# Patient Record
Sex: Female | Born: 1975 | Race: White | Hispanic: Yes | Marital: Married | Smoking: Never smoker
Health system: Southern US, Community
[De-identification: ages and names within clinical notes are randomized; demographics above are authoritative.]

## PROBLEM LIST (undated history)

## (undated) DIAGNOSIS — M329 Systemic lupus erythematosus, unspecified: Secondary | ICD-10-CM

## (undated) DIAGNOSIS — E079 Disorder of thyroid, unspecified: Secondary | ICD-10-CM

## (undated) DIAGNOSIS — J45909 Unspecified asthma, uncomplicated: Secondary | ICD-10-CM

## (undated) DIAGNOSIS — IMO0002 Reserved for concepts with insufficient information to code with codable children: Secondary | ICD-10-CM

## (undated) HISTORY — DX: Unspecified asthma, uncomplicated: J45.909

## (undated) HISTORY — DX: Reserved for concepts with insufficient information to code with codable children: IMO0002

## (undated) HISTORY — DX: Systemic lupus erythematosus, unspecified: M32.9

## (undated) HISTORY — DX: Disorder of thyroid, unspecified: E07.9

---

## 2016-04-22 ENCOUNTER — Encounter: Payer: Self-pay | Admitting: Gynecology

## 2016-04-22 ENCOUNTER — Ambulatory Visit (INDEPENDENT_AMBULATORY_CARE_PROVIDER_SITE_OTHER): Payer: BLUE CROSS/BLUE SHIELD | Admitting: Gynecology

## 2016-04-22 VITALS — BP 130/80 | Ht 62.0 in | Wt 158.0 lb

## 2016-04-22 DIAGNOSIS — N643 Galactorrhea not associated with childbirth: Secondary | ICD-10-CM | POA: Diagnosis not present

## 2016-04-22 DIAGNOSIS — N941 Unspecified dyspareunia: Secondary | ICD-10-CM

## 2016-04-22 DIAGNOSIS — Z01411 Encounter for gynecological examination (general) (routine) with abnormal findings: Secondary | ICD-10-CM

## 2016-04-22 DIAGNOSIS — L659 Nonscarring hair loss, unspecified: Secondary | ICD-10-CM | POA: Diagnosis not present

## 2016-04-22 DIAGNOSIS — N938 Other specified abnormal uterine and vaginal bleeding: Secondary | ICD-10-CM | POA: Diagnosis not present

## 2016-04-22 DIAGNOSIS — R102 Pelvic and perineal pain: Secondary | ICD-10-CM | POA: Diagnosis not present

## 2016-04-22 DIAGNOSIS — Z87410 Personal history of cervical dysplasia: Secondary | ICD-10-CM

## 2016-04-22 NOTE — Progress Notes (Signed)
Tara Caldwell 24-May-1975 [id number]   History:    41 y.o.  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 #5 a few weeks ago she has some slight discharge after intercourse with her partner but none today. #6 patient's been complaining of left lower quadrant discomfort on and off for the past few weeks #7 patient had noticed bilateral nipple discharge on and off for the past several months  Patient stated several years ago she was tested had a positive RPR but on repeat was negative she does have history of lupus. She stated she had a normal mammogram 6 months ago.  Past medical history,surgical history, family history and social history were all reviewed and documented in the EPIC chart.  Gynecologic History Patient's last menstrual period was 03/28/2016 (approximate). Contraception: Nexplanon Last Pap: See above. Results were: See above Last mammogram: 6 months ago. Results were: normal  Obstetric History OB History  Gravida Para Term Preterm AB Living  4 2     2 2   SAB TAB Ectopic Multiple Live Births  2            # Outcome Date GA Lbr Len/2nd Weight Sex Delivery Anes PTL Lv  4 SAB           3 SAB           2 Para           1 Para                ROS: A ROS was performed and pertinent positives and negatives are included in the history.  GENERAL: No fevers or chills. HEENT: No change in vision, no earache, sore throat or sinus congestion. NECK: No pain or stiffness. CARDIOVASCULAR: No chest pain or pressure. No  palpitations. PULMONARY: No shortness of breath, cough or wheeze. GASTROINTESTINAL: No abdominal pain, nausea, vomiting or diarrhea, melena or bright red blood per rectum. GENITOURINARY: No urinary frequency, urgency, hesitancy or dysuria. MUSCULOSKELETAL: No joint or muscle pain, no back pain, no recent trauma. DERMATOLOGIC: No rash, no itching, no lesions. ENDOCRINE: No polyuria, polydipsia, no heat or cold intolerance. No recent change in weight. HEMATOLOGICAL: No anemia or easy bruising or bleeding. NEUROLOGIC: No headache, seizures, numbness, tingling or weakness. PSYCHIATRIC: No depression, no loss of interest in normal activity or change in sleep pattern.     Exam: chaperone present  BP 130/80   Ht 5\' 2"  (1.575 m)   Wt 158 lb (71.7 kg)   LMP 03/28/2016 (Approximate)   BMI 28.90 kg/m   Body mass index is 28.9 kg/m.  General appearance : Well developed well nourished female. No acute distress HEENT: Eyes: no retinal hemorrhage or exudates,  Neck supple, trachea midline, no carotid bruits, no thyroidmegaly Lungs: Clear to auscultation, no rhonchi or wheezes, or rib retractions  Heart: Regular rate and rhythm, no murmurs or gallops Breast:Examined in sitting and supine position were symmetrical in appearance, no palpable masses or tenderness,  no  skin retraction, no nipple inversion, no nipple discharge, no skin discoloration, no axillary or supraclavicular lymphadenopathy Abdomen: no palpable masses or tenderness, no rebound or guarding Extremities: no edema or skin discoloration or tenderness  Pelvic:  Bartholin, Urethra, Skene Glands: Within normal limits             Vagina: No gross lesions or discharge  Cervix: No gross lesions or discharge  Uterus  anteverted, normal size, shape and consistency, non-tender and mobile  Adnexa  Without masses or tenderness  Anus and perineum  normal   Rectovaginal  normal sphincter tone without palpated masses or tenderness              Hemoccult not indicated     Assessment/Plan:  41 y.o. female for annual exam with history of bilateral galactorrhea we'll check her prolactin level today. Patient had complete blood work approximately 6 months ago whereby her hepatitis panel, HIV RPR hepatitis B and C thyroid function tests CBC lipid profile all were normal as was her TSH. Patient with past history of dysplasia Pap smear with Iris HPV done today. Because of her intermenstrual bleeding patient will return back to the office next week for sonohysterogram and endometrial biopsy. She will be given the name of the dermatologist for her to schedule an appointment for her alopecia workup since her recent TSH was normal.   Terrance Mass MD, 3:17 PM 04/22/2016

## 2016-04-22 NOTE — Addendum Note (Signed)
Addended by: Burnett Kanaris on: 04/22/2016 03:28 PM   Modules accepted: Orders

## 2016-04-23 LAB — PROLACTIN: Prolactin: 10.9 ng/mL

## 2016-04-24 LAB — PAP, TP IMAGING W/ HPV RNA, RFLX HPV TYPE 16,18/45: HPV mRNA, High Risk: NOT DETECTED

## 2016-04-28 ENCOUNTER — Telehealth: Payer: Self-pay | Admitting: *Deleted

## 2016-04-28 ENCOUNTER — Other Ambulatory Visit: Payer: Self-pay | Admitting: Gynecology

## 2016-04-28 DIAGNOSIS — R102 Pelvic and perineal pain: Secondary | ICD-10-CM

## 2016-04-28 DIAGNOSIS — N939 Abnormal uterine and vaginal bleeding, unspecified: Secondary | ICD-10-CM

## 2016-04-28 NOTE — Telephone Encounter (Signed)
Per Candis Musa at [facility] benefits verified not by code by Office Surgery & [place] in office setting. Covered at $40 copay KW CMA  Ref [id number]

## 2016-05-01 ENCOUNTER — Ambulatory Visit (INDEPENDENT_AMBULATORY_CARE_PROVIDER_SITE_OTHER): Payer: BLUE CROSS/BLUE SHIELD | Admitting: Gynecology

## 2016-05-01 ENCOUNTER — Ambulatory Visit (INDEPENDENT_AMBULATORY_CARE_PROVIDER_SITE_OTHER): Payer: BLUE CROSS/BLUE SHIELD

## 2016-05-01 ENCOUNTER — Encounter: Payer: Self-pay | Admitting: Gynecology

## 2016-05-01 DIAGNOSIS — R102 Pelvic and perineal pain: Secondary | ICD-10-CM

## 2016-05-01 DIAGNOSIS — Z975 Presence of (intrauterine) contraceptive device: Principal | ICD-10-CM

## 2016-05-01 DIAGNOSIS — Z978 Presence of other specified devices: Secondary | ICD-10-CM

## 2016-05-01 DIAGNOSIS — N941 Unspecified dyspareunia: Secondary | ICD-10-CM

## 2016-05-01 DIAGNOSIS — N938 Other specified abnormal uterine and vaginal bleeding: Secondary | ICD-10-CM

## 2016-05-01 DIAGNOSIS — N939 Abnormal uterine and vaginal bleeding, unspecified: Secondary | ICD-10-CM | POA: Diagnosis not present

## 2016-05-01 DIAGNOSIS — N921 Excessive and frequent menstruation with irregular cycle: Secondary | ICD-10-CM

## 2016-05-01 NOTE — Progress Notes (Signed)
   Patient's a 41 year old that was seen the office for the first time as a new patient early this month see previous note for details. Patient in [place] placed in [place] which is due to be removed this February and a complaint of some irregular vaginal bleeding and is here for sonohysterogram. Her recent Pap smear was normal as well. Patient has 2 children from a previous marriage and is contemplating on getting pregnant after the Nexplanon was removed.  Ultrasound/sonohysterogram: Uterus measured 8.7 x 5.6 x 4.4 cm within a major stripe at 2.5 mm. Right and left ovary were normal. No fluid in the cul-de-sac. No adnexal masses. A small nabothian cyst was noted near the cervix. The cervix was cleansed with Betadine solution a sterile catheter was introduced into the uterine cavity and after instilling normal saline and there was no intracavitary defect.  Assessment /plan 41 year old patient with breakthrough bleeding on Nexplanon soon to expired will return back next month to remove the Nexplanon. We had a discussion on the risk of advanced maternal age/pregnancy over the age of 2 in the future to include aneuploidy, hypertension, diabetes, and premature delivery. She states that her new spouse is 43 years of age. We discussed the female factor contributions to aneuploidy as well. She will think about it in the meantime she's going to start taking prenatal vitamins.

## 2016-05-16 ENCOUNTER — Ambulatory Visit (INDEPENDENT_AMBULATORY_CARE_PROVIDER_SITE_OTHER): Payer: BLUE CROSS/BLUE SHIELD | Admitting: Gynecology

## 2016-05-16 ENCOUNTER — Encounter: Payer: Self-pay | Admitting: Gynecology

## 2016-05-16 VITALS — BP 110/80 | Ht 62.0 in | Wt 158.0 lb

## 2016-05-16 DIAGNOSIS — Z3046 Encounter for surveillance of implantable subdermal contraceptive: Secondary | ICD-10-CM | POA: Diagnosis not present

## 2016-05-16 MED ORDER — DICLOXACILLIN SODIUM 250 MG PO CAPS: 250.0000 mg | ORAL_CAPSULE | Freq: Three times a day (TID) | ORAL | 0 refills | Status: DC

## 2016-05-16 NOTE — Progress Notes (Signed)
   Patient is a 41 year old presented to the office today requesting removal of 2 subdermal contraceptive implants similar to Nexplanon that was placed in [place] 3 years ago. She had been suffering from dysfunctional uterine bleeding and on the office visit on June 25 she had an ultrasound sonohysterogram which demonstrated the following:  Ultrasound/sonohysterogram: Uterus measured 8.7 x 5.6 x 4.4 cm within a major stripe at 2.5 mm. Right and left ovary were normal. No fluid in the cul-de-sac. No adnexal masses. A small nabothian cyst was noted near the cervix. The cervix was cleansed with Betadine solution a sterile catheter was introduced into the uterine cavity and after instilling normal saline and there was no intracavitary defect.  Procedure note to remove Nexplanon (2 capsules)                                                            Nexplanon procedure note (removal)  The patient presented to the office today requesting for removal of her Nexplanon that was placed in the year 2015 on her left arm.   On examination the nexplanon implant was palpated and the distal end  (end  closest to the elbow) was marked. The area was sterilized with Betadine solution. 1% lidocaine was used for local anesthesia and approximately 1 cc  was injected into the site that was marked where  the incision was to be made. The local anesthetic was injected under the implant in an effort to keep it  close to the skin surface. Slight pressure pushing downward was made at the proximal end  of the implant in an effort to stabilize it. A bulge appeared indicating the distal end of the implant. Starting at the distal tip of the implant, a small longitudinal incision of 2 mm was made towards the elbow. By gently pushing the implant toward the incision the tip became visible. Grasping the 2 implants  with a curved forcep facilitated in gently removing the implant. Full confirmation of both  implants which were 4  cm long was inspected and was intact and was shown to the patient and discarded. After removing the implant, the incision was closed 2 interrupted sutures of 3-0 Vicryl suture and apply Neosporin in and applying and adhesive Kerlix bandage. Patient will be instructed to remove the pressure bandage in 24 hours and the Steri-Strips in 3-5 days. She will return to the office next week for placement of the [place] IUD which she is interested in. Literature information was provided.  Prophylactically patient will be placed on dicloxacillin 250 mg 3 times a day for 5 days

## 2016-05-16 NOTE — Addendum Note (Signed)
Addended by: Terrance Mass on: 05/16/2016 04:28 PM   Modules accepted: Orders

## 2016-05-16 NOTE — Patient Instructions (Signed)
Colocacin de un dispositivo intrauterino (Intrauterine Device Insertion) El dispositivo intrauterino (DIU) se inserta en el tero para evitar el embarazo. Existen dos tipos de DIU:  DIU de cobre: este tipo de DIU crea un ambiente desfavorable para la supervivencia de los espermatozoides. El mecanismo de accin no se conoce con certeza. Puede permanecer colocado durante 10 aos.  DIU hormonal: este tipo de DIU contiene la hormona progestina (progesterona sinttica). La progestina espesa el moco cervical y evita que los espermatozoides ingresen al tero y tambin afina la membrana que tapiza interiormente al tero. No hay evidencias de que el DIU hormonal evite la implantacin. Un DIU hormonal puede dejarse colocado durante un mximo de 5aos, y un DIU hormonal diferente, durante un mximo de 3aos. Es el mtodo anticonceptivo de mejor relacin entre el costo y la efectividad si se deja colocado todo el tiempo de su duracin. Se puede retirar en cualquier momento. INFORME A SU MDICO:  Cualquier alergia que tenga.  Todos los medicamentos que utiliza, incluyendo vitaminas, hierbas, gotas oftlmicas, cremas y medicamentos de venta libre.  Problemas previos que usted o los miembros de su familia hayan tenido con el uso de anestsicos.  Enfermedades de la sangre.  Cirugas previas.  Posible embarazo.  Enfermedades patolgicas.  RIESGOS Y COMPLICACIONES Generalmente, la colocacin de un dispositivo intrauterino es un procedimiento seguro. Sin embargo, como en cualquier procedimiento, pueden surgir complicaciones. Las complicaciones posibles son:  Pinchazo accidental (perforacin) del tero.  La colocacin accidental del DIU en la capa muscular del tero (miometrio) o fuera del tero. Si esto sucede, el DIU puede quedar flotando alrededor de los intestinos y debe extraerse quirrgicamente.  El DIU puede salirse del tero (expulsin). Esto es ms frecuente en las mujeres que han dado a luz  recientemente.  Embarazo en la trompa de Falopio (ectpico).  Enfermedad plvica inflamatoria (EPI), una infeccin del tero y las trompas de Falopio. Mayor riesgo de tener EPI durante los primeros 20das despus de la colocacin del DIU; sin embargo, el riesgo general sigue siendo muy bajo. ANTES DEL PROCEDIMIENTO  Programe la insercin del DIU para cuando tenga el perodo menstrual o inmediatamente despus, para asegurarse de que no est embarazada. La colocacin del DIU se tolera mejor poco despus del ciclo menstrual.  Podra necesitar anlisis o ser examinada para asegurarse de que no est embarazada. ? Es posible que tenga que hacerse un test de embarazo. ? Podrn indicarle anlisis para descartar enfermedades de transmisin sexual (ETS) antes de la colocacin. Colocar un DIU a una mujer que tiene una infeccin puede hacer que esta empeore.  Podrn indicarle que tome un analgsico 1 o 2 horas antes del procedimiento.  Se realiza un examen para determinar el tamao y la posicin del tero.  Consulte a su mdico si debe cambiar o suspender los medicamentos que toma habitualmente.  PROCEDIMIENTO  Se coloca un instrumento (espculo) en la vagina que le permite a su mdico observar la parte inferior del tero (cuello del tero).  El cuello del tero se prepara con un medicamento que disminuye el riesgo de infeccin.  Tal vez le apliquen un medicamento para adormecer cada lado del cuello del tero (bloqueo intracervical o paracervical). Esto se realiza para evitar y controlar cualquier malestar con la insercin.  Se insertar un instrumento (sonda uterina) en el tero para determinar la longitud de la cavidad uterina y la direccin hacia la cual el tero puede inclinarse.  Se coloca un dispositivo delgado (insertador del DIU) a travs del canal del   cuello del tero hasta el tero.  El DIU se coloca en la cavidad uterina y el dispositivo de insercin se retira.  Se recorta el hilo de  nylon que est unido al DIU y que se utiliza para su extraccin final. Se recorta de forma que quede en la vagina, apenas afuera del cuello uterino.  DESPUS DEL PROCEDIMIENTO  Podr tener sangrado despus del procedimiento. Esto es normal. Vara desde un sangrado ligero durante un par de das hasta un sangrado similar al menstrual.  Podr sentir clicos leves.  Esta informacin no tiene como fin reemplazar el consejo del mdico. Asegrese de hacerle al mdico cualquier pregunta que tenga. Document Released: 12/17/2011 Document Revised: 01/12/2013 Document Reviewed: 09/12/2012 Elsevier Interactive Patient Education  2017 Elsevier Inc.  

## 2016-05-22 ENCOUNTER — Ambulatory Visit (INDEPENDENT_AMBULATORY_CARE_PROVIDER_SITE_OTHER): Payer: BLUE CROSS/BLUE SHIELD | Admitting: Gynecology

## 2016-05-22 ENCOUNTER — Encounter: Payer: Self-pay | Admitting: Gynecology

## 2016-05-22 VITALS — BP 118/80 | Ht 62.0 in | Wt 158.0 lb

## 2016-05-22 DIAGNOSIS — Z4802 Encounter for removal of sutures: Secondary | ICD-10-CM

## 2016-05-22 NOTE — Progress Notes (Signed)
   Patient is a 41 year old that presented to the office today to remove her sutures from the left upper medial arm as a result of a incision and had to be made on February 9 to remove 2 subdermal contraceptive implant that were placed in [place]. She is otherwise asymptomatic. The reason was removed because she was having dysfunctional uterine bleeding. She did have a recent ultrasound sonohysterogram which was normal. The 2 sutures were removed Neosporin was applied along with a Band-Aid. Patient otherwise scheduled to return back next year for annual exam or when necessary. They're going to be using barrier contraception for now. Not interested in any other form of contraception.

## 2016-08-20 ENCOUNTER — Encounter: Payer: Self-pay | Admitting: Gynecology

## 2016-10-02 DIAGNOSIS — H40013 Open angle with borderline findings, low risk, bilateral: Secondary | ICD-10-CM | POA: Diagnosis not present

## 2016-12-02 DIAGNOSIS — H40013 Open angle with borderline findings, low risk, bilateral: Secondary | ICD-10-CM | POA: Diagnosis not present

## 2016-12-02 DIAGNOSIS — Z803 Family history of malignant neoplasm of breast: Secondary | ICD-10-CM | POA: Diagnosis not present

## 2016-12-02 DIAGNOSIS — Z1231 Encounter for screening mammogram for malignant neoplasm of breast: Secondary | ICD-10-CM | POA: Diagnosis not present

## 2016-12-16 ENCOUNTER — Ambulatory Visit (INDEPENDENT_AMBULATORY_CARE_PROVIDER_SITE_OTHER): Payer: BLUE CROSS/BLUE SHIELD | Admitting: Gynecology

## 2016-12-16 ENCOUNTER — Encounter: Payer: Self-pay | Admitting: Gynecology

## 2016-12-16 VITALS — BP 118/78

## 2016-12-16 DIAGNOSIS — Z3009 Encounter for other general counseling and advice on contraception: Secondary | ICD-10-CM

## 2016-12-16 DIAGNOSIS — N644 Mastodynia: Secondary | ICD-10-CM

## 2016-12-16 NOTE — Progress Notes (Signed)
    Tara Caldwell 1975/05/28 [id number]        41 y.o.  [id number] patient presents to discuss 2 issues with an interpreter:  1. Bilateral breast tenderness that comes and goes. Had a recent mammogram at [facility] that her husband read her the report any concerns her because it talked about densities. No palpable abnormalities on self-exam. No nipple discharge. Tenderness occurs in both tail of [place]ence regions, comes and goes with no persistent discomfort. 2. Contraception. Had Nexplanon equivalent put in [place] that Dr. Toney Rakes ultimately removed 05/2016. Has not been consistently using any contraception.  Past medical history,surgical history, problem list, medications, allergies, family history and social history were all reviewed and documented in the EPIC chart.  Directed ROS with pertinent positives and negatives documented in the history of present illness/assessment and plan.  Exam: Wandra Scot assistant Vitals:   12/16/16 1459  BP: 118/78   General appearance:  Normal Both breasts were examined lying and sitting without masses, retractions, discharge, adenopathy.  Assessment/Plan:  41 y.o. [id number] with:  1. Intermittent bilateral mastalgia which I think is physiologic.  Reviewed mammographic report with her that was read normal. It was a category C and a made a comment that due to the density smaller lesions may not be visualized. I think the patient's husband was confused when you read this part of the report about density which scared her. I reassured her that this was a normal mammogram report. I reviewed in detail the various categories associated with mammogram reports. I also discussed the physiologic nature of bilateral mastalgia and how it can fluctuate throughout her cycle. She will continue with self breast exams and as long as no palpable abnormalities and the discomfort comes and goes and she will monitor. If any concern or issue she'll follow up for  reevaluation. We'll plan on repeating her mammogram a year from her last study. 2. Contraception. We discussed contraception in detail and we reviewed all available options to include condoms, pills, patch, ring, Depo-Provera, Nexplanon, IUDs and sterilization both female and female. The pros and cons and the risks and benefits of each choice were reviewed. The patient had the Nexplanon equivalent in [place] and also had an IUD but noted that her period was heavier with the IUD. I reviewed with her that this probably was not a progesterone coated IUD and that the Mirena is none to lighten or eliminate menses consistently. Patient at this point is leaning towards Mirena IUD and she is going to schedule an appointment for placement. She'll follow up if she has any further questions pertaining to this.   Greater than 50% of her 25 minute visit was spent in direct face to face counseling and coordination of care with the patient.     Anastasio Auerbach MD, 3:27 PM 12/16/2016

## 2016-12-16 NOTE — Patient Instructions (Signed)
Follow up for the IUD placement appointment as you arrange.

## 2017-01-05 ENCOUNTER — Encounter: Payer: Self-pay | Admitting: Gynecology

## 2017-01-05 ENCOUNTER — Ambulatory Visit (INDEPENDENT_AMBULATORY_CARE_PROVIDER_SITE_OTHER): Payer: BLUE CROSS/BLUE SHIELD | Admitting: Gynecology

## 2017-01-05 VITALS — BP 116/76

## 2017-01-05 DIAGNOSIS — Z30430 Encounter for insertion of intrauterine contraceptive device: Secondary | ICD-10-CM

## 2017-01-05 DIAGNOSIS — R8290 Unspecified abnormal findings in urine: Secondary | ICD-10-CM

## 2017-01-05 NOTE — Progress Notes (Signed)
    Tara Caldwell 04-17-1975 [id number]        42 y.o.  [id number]  presents for Mirena IUD placement with her interpreter. She has read through the booklet, has no contraindications and signed the consent form. She currently is on a normal menses.  I reviewed the insertional process with her as well as the risks to include infection, either immediate or long-term, uterine perforation or migration requiring surgery to remove, other complications such as pain and possibility of failure with subsequent pregnancy. Patient also notes preceding her menses her urine smelled strong. No frequency dysuria or urgency low back pain. Since her menses is tapering off now she no longer is having the strong urine smell.  Exam with [name] assistant Vitals:   01/05/17 1120  BP: 116/76    Pelvic: External BUS vagina normal. Cervix Normal. Uterus Anteverted normal size shape contour midline mobile nontender. Adnexa without masses or tenderness.  Procedure: The cervix was cleansed with Betadine, anterior lip grasped with a single-tooth tenaculum, the uterus was sounded and a Mirena IUD was placed according to manufacturer's recommendations without difficulty. The strings were trimmed. The patient tolerated well and will follow up in one month for a postinsertional check.  Will check urinalysis today.  Lot number:  [id number]    Anastasio Auerbach MD, 11:46 AM 01/05/2017

## 2017-01-05 NOTE — Patient Instructions (Signed)
Colocacin de un dispositivo intrauterino (Intrauterine Device Insertion) El dispositivo intrauterino (DIU) se inserta en el tero para Therapist, occupational. [employer] tipos de DIU:  DIU de cobre: este tipo de DIU crea un ambiente desfavorable para la supervivencia de los espermatozoides. El mecanismo de accin no se conoce con Media planner. Puede permanecer colocado durante 10 aos.  DIU hormonal: este tipo de DIU contiene la hormona progestina (progesterona sinttica). La progestina espesa el moco cervical y evita que los espermatozoides ingresen al tero y tambin afina la membrana que tapiza interiormente al tero. No hay evidencias de que el DIU hormonal evite la implantacin. Un DIU hormonal puede dejarse colocado durante un mximo de 51aos, y un DIU hormonal diferente, durante un mximo de 3aos. Es el mtodo anticonceptivo de mejor relacin entre el costo y la efectividad si se deja colocado todo el tiempo de su duracin. Se puede retirar en cualquier momento. INFORME A SU MDICO:  Cualquier alergia que tenga.  Todos los [employer] [place], incluyendo vitaminas, hierbas, gotas oftlmicas, cremas y medicamentos de venta libre.  Problemas previos que usted o los [employer] de su familia hayan tenido con el uso de anestsicos.  Enfermedades de [employer].  Cirugas previas.  Posible embarazo.  Enfermedades patolgicas.  RIESGOS Y COMPLICACIONES Generalmente, la colocacin de un dispositivo intrauterino es un procedimiento seguro. Sin embargo, Games developer procedimiento, pueden surgir complicaciones. Las complicaciones posibles son:  Pinchazo accidental (perforacin) del tero.  La colocacin accidental del DIU en la capa muscular del tero (miometrio) o fuera del tero. Si esto sucede, el DIU puede quedar flotando alrededor [employer] intestinos y debe extraerse quirrgicamente.  El DIU puede salirse del tero (expulsin). Esto es ms frecuente en las mujeres que han dado a luz  recientemente.  Embarazo en la trompa de Falopio (ectpico).  Enfermedad plvica inflamatoria (EPI), una infeccin del tero y las trompas de [place]. Mayor riesgo de tener EPI durante los primeros 20das despus de la colocacin del DIU; sin embargo, el riesgo general sigue siendo muy bajo. ANTES DEL PROCEDIMIENTO  Programe la insercin del DIU para cuando tenga el perodo menstrual o inmediatamente despus, para asegurarse de que no est embarazada. La colocacin del DIU se tolera mejor poco despus del ciclo menstrual.  Podra necesitar anlisis o ser examinada para asegurarse de que no est embarazada. ? Es posible que tenga que hacerse un test de [place]. ? Podrn indicarle anlisis para descartar enfermedades de transmisin sexual (ETS) antes de Programmer, systems. Colocar un DIU a una mujer que tiene una infeccin puede hacer que esta empeore.  Podrn indicarle que tome un analgsico 1 o 2 horas antes del procedimiento.  Se realiza un examen para determinar el tamao y la posicin del tero.  Consulte a su mdico si debe cambiar o suspender los medicamentos que toma habitualmente.  PROCEDIMIENTO  Se coloca un instrumento (espculo) en la vagina que le permite a su mdico observar la parte inferior del tero (cuello del tero).  El cuello del tero se prepara con un medicamento que disminuye el riesgo de infeccin.  Tal vez le apliquen un medicamento para adormecer cada lado del cuello del tero (bloqueo intracervical o paracervical). Esto se realiza para evitar y Engineer, maintenance con la insercin.  Se insertar un instrumento (sonda uterina) en el tero para determinar la longitud de la cavidad uterina y la direccin hacia la cual el tero puede inclinarse.  Se coloca un dispositivo delgado (insertador del DIU) a travs del canal del  cuello del tero [employer].  El DIU se coloca en la cavidad uterina y el dispositivo de insercin se retira.  Se recorta el hilo de  nylon que est unido al DIU y que se [place] para su extraccin final. Se recorta de forma que quede en la vagina, apenas afuera del cuello uterino.  DESPUS DEL PROCEDIMIENTO  Podr tener sangrado despus del procedimiento. Esto es normal. Vara desde un sangrado ligero durante un par de [employer] un sangrado similar al menstrual.  Podr sentir clicos leves.  Esta informacin no tiene Marine scientist el consejo del mdico. Asegrese de hacerle al mdico cualquier pregunta que tenga. Document Released: 12/17/2011 Document Revised: 01/12/2013 Document Reviewed: 09/12/2012 Elsevier Interactive Patient Education  2017 [employer].

## 2017-01-06 ENCOUNTER — Encounter: Payer: Self-pay | Admitting: Gynecology

## 2017-01-06 LAB — URINALYSIS W MICROSCOPIC + REFLEX CULTURE
Bacteria, UA: NONE SEEN /HPF
Bilirubin Urine: NEGATIVE
GLUCOSE, UA: NEGATIVE
HGB URINE DIPSTICK: NEGATIVE
HYALINE CAST: NONE SEEN /LPF
Ketones, ur: NEGATIVE
Leukocyte Esterase: NEGATIVE
Nitrites, Initial: NEGATIVE
PH: 5 (ref 5.0–8.0)
PROTEIN: NEGATIVE
RBC / HPF: NONE SEEN /HPF (ref 0–2)
Specific Gravity, Urine: 1.02 (ref 1.001–1.03)
WBC, UA: NONE SEEN /HPF (ref 0–5)

## 2017-01-06 LAB — NO CULTURE INDICATED

## 2017-02-05 ENCOUNTER — Encounter: Payer: Self-pay | Admitting: Gynecology

## 2017-02-05 ENCOUNTER — Ambulatory Visit (INDEPENDENT_AMBULATORY_CARE_PROVIDER_SITE_OTHER): Payer: BLUE CROSS/BLUE SHIELD | Admitting: Gynecology

## 2017-02-05 VITALS — BP 118/74

## 2017-02-05 DIAGNOSIS — N926 Irregular menstruation, unspecified: Secondary | ICD-10-CM

## 2017-02-05 DIAGNOSIS — Z30431 Encounter for routine checking of intrauterine contraceptive device: Secondary | ICD-10-CM

## 2017-02-05 DIAGNOSIS — N941 Unspecified dyspareunia: Secondary | ICD-10-CM

## 2017-02-05 DIAGNOSIS — N644 Mastodynia: Secondary | ICD-10-CM

## 2017-02-05 MED ORDER — MEGESTROL ACETATE 20 MG PO TABS: 20.0000 mg | ORAL_TABLET | Freq: Every day | ORAL | 0 refills | Status: DC

## 2017-02-05 NOTE — Progress Notes (Signed)
    Tara Caldwell 1975-12-02 [id number]        41 y.o.  [id number] presents for IUD follow-up exam.  Had Mirena IUD placed 01/05/2017.  Also complaining of:  1. Bilateral breast tenderness since placement of her IUD.  No masses or discharge on self exams. 2. Pain with intercourse when she tried to have intercourse after IUD placement.  Felt inflamed or swollen.  No vaginal discharge odor or irritation.  No urinary symptoms such as frequency dysuria urgency. 3. Continued bleeding over the last 2 weeks on a daily basis.  No significant cramping or pain.  Using phone service interpreter.  Past medical history,surgical history, problem list, medications, allergies, family history and social history were all reviewed and documented in the EPIC chart.  Directed ROS with pertinent positives and negatives documented in the history of present illness/assessment and plan.  Exam: [name] assistant Vitals:   02/05/17 1442  BP: 118/74   General appearance:  Normal Both breast exam and lying and sitting without masses, retractions, discharge, adenopathy.  No perceived tenderness during the exam. Abdomen soft nontender without masses guarding rebound Pelvic external BUS vagina normal.  Cervix normal.  IUD string visualized within cervical office using colposcope.  Uterus grossly normal size midline mobile nontender.  Adnexa without masses or tenderness.  Assessment/Plan:  41 y.o. [id number] with follow-up IUD check.  Exam is normal.  IUD string visualized within cervical office.  Reviewed with patient through the interpreter I think her breast tenderness is secondary to the IUD.  Probable absorption of some progesterone which may have contributed to this.  Exam shows no abnormalities.  Recent mammogram in August was normal.  The pain is bilateral and diffuse.  Recommend observation for now.  Will follow-up if this continues over the next month or so.  I also reviewed with her I think the pain  with intercourse is probably related to just having the IUD placed causing some inflammation and irritation which was uncomfortable during intercourse.  I recommended that she follow for now and assuming this resolves then will follow.  If pain with intercourse would continue she will represent for evaluation.  Lastly I discussed her ongoing bleeding and we will go ahead and treat her with Megace 20 mg daily times 10 days to help stabilize the endometrium and hopefully resolve her bleeding.  She will follow-up if this continues to be an issue.  Otherwise she will follow-up when due for annual exam.  Greater than 50% of my time was spent in direct face to face counseling and coordination of care with the patient.     Anastasio Auerbach MD, 3:51 PM 02/05/2017

## 2017-02-05 NOTE — Patient Instructions (Signed)
Follow-up if your breast tenderness, irregular bleeding, pain with intercourse continue.  Otherwise follow-up when you are due for your annual exam.

## 2017-04-21 ENCOUNTER — Encounter: Payer: Self-pay | Admitting: Emergency Medicine

## 2017-04-21 ENCOUNTER — Other Ambulatory Visit: Payer: Self-pay

## 2017-04-21 ENCOUNTER — Ambulatory Visit: Payer: BLUE CROSS/BLUE SHIELD | Admitting: Emergency Medicine

## 2017-04-21 VITALS — BP 94/60 | HR 99 | Temp 99.5°F | Resp 16 | Ht 61.25 in | Wt 153.8 lb

## 2017-04-21 DIAGNOSIS — Z8639 Personal history of other endocrine, nutritional and metabolic disease: Secondary | ICD-10-CM | POA: Diagnosis not present

## 2017-04-21 DIAGNOSIS — R51 Headache: Secondary | ICD-10-CM | POA: Diagnosis not present

## 2017-04-21 DIAGNOSIS — J069 Acute upper respiratory infection, unspecified: Secondary | ICD-10-CM | POA: Insufficient documentation

## 2017-04-21 DIAGNOSIS — R531 Weakness: Secondary | ICD-10-CM | POA: Diagnosis not present

## 2017-04-21 DIAGNOSIS — Z832 Family history of diseases of the blood and blood-forming organs and certain disorders involving the immune mechanism: Secondary | ICD-10-CM | POA: Diagnosis not present

## 2017-04-21 DIAGNOSIS — Z8269 Family history of other diseases of the musculoskeletal system and connective tissue: Secondary | ICD-10-CM

## 2017-04-21 DIAGNOSIS — R519 Headache, unspecified: Secondary | ICD-10-CM | POA: Insufficient documentation

## 2017-04-21 NOTE — Progress Notes (Signed)
Tara Caldwell 42 y.o.   Chief Complaint  Patient presents with  . Establish Care    and Thyroid problem x 1 year  . Headache    with runny nose    HISTORY OF PRESENT ILLNESS: This is a 42 y.o. female wants to establish care; healthy with no chronic problems. States she was told she has a thyroid problem (during immigration process); also c/o general weakness and states she has strong FHx of SLE. Also had recent URI with some residual sinus congestion/headache.  HPI   Prior to Admission medications   Medication Sig Start Date End Date Taking? Authorizing Provider  levonorgestrel (MIRENA) 20 MCG/24HR IUD 1 each by Intrauterine route once.    [provider]  megestrol (MEGACE) 20 MG tablet Take 1 tablet (20 mg total) by mouth daily. Patient not taking: Reported on 04/21/2017 02/05/17   Fontaine, Belinda Block, MD    No Known Allergies  There are no active problems to display for this patient.   Past Medical History:  Diagnosis Date  . Asthma     No past surgical history on file.  Social History   Socioeconomic History  . Marital status: Married    Spouse name: Not on file  . Number of children: Not on file  . Years of education: Not on file  . Highest education level: Not on file  Social Needs  . Financial resource strain: Not on file  . Food insecurity - worry: Not on file  . Food insecurity - inability: Not on file  . Transportation needs - medical: Not on file  . Transportation needs - non-medical: Not on file  Occupational History  . Not on file  Tobacco Use  . Smoking status: Never Smoker  . Smokeless tobacco: Never Used  Substance and Sexual Activity  . Alcohol use: No  . Drug use: No  . Sexual activity: Yes    Birth control/protection: None  Other Topics Concern  . Not on file  Social History Narrative  . Not on file    Family History  Problem Relation Age of Onset  . Lupus Mother   . Heart disease Father   . Heart disease  Maternal Grandmother      Review of Systems  Constitutional: Positive for malaise/fatigue. Negative for chills, fever and weight loss.  HENT: Positive for congestion and sinus pain.   Eyes: Negative.  Negative for blurred vision, double vision, discharge and redness.  Respiratory: Negative.  Negative for cough and shortness of breath.   Cardiovascular: Negative.  Negative for chest pain, palpitations, claudication and leg swelling.  Gastrointestinal: Negative.  Negative for abdominal pain, blood in stool, diarrhea, nausea and vomiting.  Genitourinary: Negative.  Negative for dysuria and hematuria.  Musculoskeletal: Negative.  Negative for back pain, joint pain, myalgias and neck pain.  Skin: Negative.  Negative for rash.  Neurological: Positive for weakness and headaches. Negative for dizziness, tingling, sensory change and focal weakness.  Endo/Heme/Allergies: Negative.   All other systems reviewed and are negative.   Vitals:   04/21/17 1517  BP: 94/60  Pulse: 99  Resp: 16  Temp: 99.5 F (37.5 C)  SpO2: 100%    Physical Exam  Constitutional: She is oriented to person, place, and time. She appears well-developed and well-nourished.  HENT:  Head: Normocephalic and atraumatic.  Right Ear: External ear normal.  Left Ear: External ear normal.  Nose: Nose normal.  Mouth/Throat: Oropharynx is clear and moist.  Eyes: Conjunctivae and  EOM are normal. Pupils are equal, round, and reactive to light.  Neck: Normal range of motion. Neck supple. No JVD present. No thyromegaly present.  Cardiovascular: Normal rate, regular rhythm and normal heart sounds.  Pulmonary/Chest: Effort normal and breath sounds normal.  Abdominal: Soft. Bowel sounds are normal. She exhibits no distension. There is no tenderness.  Musculoskeletal: Normal range of motion. She exhibits no edema or tenderness.  Lymphadenopathy:    She has no cervical adenopathy.  Neurological: She is alert and oriented to person,  place, and time. She displays normal reflexes. No cranial nerve deficit or sensory deficit. She exhibits normal muscle tone. Coordination normal.  Skin: Skin is warm and dry. Capillary refill takes less than 2 seconds. No rash noted.  Psychiatric: She has a normal mood and affect. Her behavior is normal.  Vitals reviewed.   A total of 30 minutes was spent in the room with the patient, greater than 50% of which was in counseling/coordination of care.   ASSESSMENT & PLAN:  Wateen was seen today for establish care and headache.  Diagnoses and all orders for this visit:  General weakness -     ANA,IFA RA Diag Pnl w/rflx Tit/Patn -     CBC with Differential/Platelet -     Comprehensive metabolic panel  History of thyroid disorder -     Thyroid Profile  Viral upper respiratory tract infection  Sinus headache  Family history of systemic lupus erythematosus (SLE) in mother -     ANA,IFA RA Diag Pnl w/rflx Tit/Patn   Patient Instructions       IF you received an x-ray today, you will receive an invoice from [facility]. Please contact [facility] at [phone] with questions or concerns regarding your invoice.   IF you received labwork today, you will receive an invoice from [facility]. Please contact [facility] at [phone] with questions or concerns regarding your invoice.   Our billing staff will not be able to assist you with questions regarding bills from these companies.  You will be contacted with the lab results as soon as they are available. The fastest way to get your results is to activate your My Chart account. Instructions are located on the last page of this paperwork. If you have not heard from [place] regarding the results in 2 weeks, please contact this office.    Health Maintenance, Female Adopting a healthy lifestyle and getting preventive care can go a long way to promote health and wellness. Talk with your health care provider about what  schedule of regular examinations is right for you. This is a good chance for you to check in with your provider about disease prevention and staying healthy. In between checkups, there are plenty of things you can do on your own. Experts have done a lot of research about which lifestyle changes and preventive measures are most likely to keep you healthy. Ask your health care provider for more information. Weight and diet Eat a healthy diet  Be sure to include plenty of vegetables, fruits, low-fat dairy products, and lean protein.  Do not eat a lot of foods high in solid fats, added sugars, or salt.  Get regular exercise. This is one of the most important things you can do for your health. ? Most adults should exercise for at least 150 minutes each week. The exercise should increase your heart rate and make you sweat (moderate-intensity exercise). ? Most adults should also do strengthening exercises at least twice a week. This  is in addition to the moderate-intensity exercise.  Maintain a healthy weight  Body mass index (BMI) is a measurement that can be used to identify possible weight problems. It estimates body fat based on height and weight. Your health care provider can help determine your BMI and help you achieve or maintain a healthy weight.  For females 72 years of age and older: ? A BMI below 18.5 is considered underweight. ? A BMI of 18.5 to 24.9 is normal. ? A BMI of 25 to 29.9 is considered overweight. ? A BMI of 30 and above is considered obese.  Watch levels of cholesterol and blood lipids  You should start having your blood tested for lipids and cholesterol at 44 years of age, then have this test every 5 years.  You may need to have your cholesterol levels checked more often if: ? Your lipid or cholesterol levels are high. ? You are older than 44 years of age. ? You are at high risk for heart disease.  Cancer screening Lung Cancer  Lung cancer screening is recommended  for adults 77-59 years old who are at high risk for lung cancer because of a history of smoking.  A yearly low-dose CT scan of the lungs is recommended for people who: ? Currently smoke. ? Have quit within the past 15 years. ? Have at least a 30-pack-year history of smoking. A pack year is smoking an average of one pack of cigarettes a day for 1 year.  Yearly screening should continue until it has been 15 years since you quit.  Yearly screening should stop if you develop a health problem that would prevent you from having lung cancer treatment.  Breast Cancer  Practice breast self-awareness. This means understanding how your breasts normally appear and feel.  It also means doing regular breast self-exams. Let your health care provider know about any changes, no matter how small.  If you are in your 20s or 30s, you should have a clinical breast exam (CBE) by a health care provider every 1-3 years as part of a regular health exam.  If you are 62 or older, have a CBE every year. Also consider having a breast X-ray (mammogram) every year.  If you have a family history of breast cancer, talk to your health care provider about genetic screening.  If you are at high risk for breast cancer, talk to your health care provider about having an MRI and a mammogram every year.  Breast cancer gene (BRCA) assessment is recommended for women who have family members with BRCA-related cancers. BRCA-related cancers include: ? Breast. ? Ovarian. ? Tubal. ? Peritoneal cancers.  Results of the assessment will determine the need for genetic counseling and BRCA1 and BRCA2 testing.  Cervical Cancer Your health care provider may recommend that you be screened regularly for cancer of the pelvic organs (ovaries, uterus, and vagina). This screening involves a pelvic examination, including checking for microscopic changes to the surface of your cervix (Pap test). You may be encouraged to have this screening done  every 3 years, beginning at age 44.  For women ages 52-65, health care providers may recommend pelvic exams and Pap testing every 3 years, or they may recommend the Pap and pelvic exam, combined with testing for human papilloma virus (HPV), every 5 years. Some types of HPV increase your risk of cervical cancer. Testing for HPV may also be done on women of any age with unclear Pap test results.  Other health care providers  may not recommend any screening for nonpregnant women who are considered low risk for pelvic cancer and who do not have symptoms. Ask your health care provider if a screening pelvic exam is right for you.  If you have had past treatment for cervical cancer or a condition that could lead to cancer, you need Pap tests and screening for cancer for at least 20 years after your treatment. If Pap tests have been discontinued, your risk factors (such as having a new sexual partner) need to be reassessed to determine if screening should resume. Some women have medical problems that increase the chance of getting cervical cancer. In these cases, your health care provider may recommend more frequent screening and Pap tests.  Colorectal Cancer  This type of cancer can be detected and often prevented.  Routine colorectal cancer screening usually begins at 44 years of age and continues through 44 years of age.  Your health care provider may recommend screening at an earlier age if you have risk factors for colon cancer.  Your health care provider may also recommend using home test kits to check for hidden blood in the stool.  A small camera at the end of a tube can be used to examine your colon directly (sigmoidoscopy or colonoscopy). This is done to check for the earliest forms of colorectal cancer.  Routine screening usually begins at age 23.  Direct examination of the colon should be repeated every 5-10 years through 44 years of age. However, you may need to be screened more often if  early forms of precancerous polyps or small growths are found.  Skin Cancer  Check your skin from head to toe regularly.  Tell your health care provider about any new moles or changes in moles, especially if there is a change in a mole's shape or color.  Also tell your health care provider if you have a mole that is larger than the size of a pencil eraser.  Always use sunscreen. Apply sunscreen liberally and repeatedly throughout the day.  Protect yourself by wearing long sleeves, pants, a wide-brimmed hat, and sunglasses whenever you are outside.  Heart disease, diabetes, and high blood pressure  High blood pressure causes heart disease and increases the risk of stroke. High blood pressure is more likely to develop in: ? People who have blood pressure in the high end of the normal range (130-139/85-89 mm Hg). ? People who are overweight or obese. ? People who are African American.  If you are 87-73 years of age, have your blood pressure checked every 3-5 years. If you are 80 years of age or older, have your blood pressure checked every year. You should have your blood pressure measured twice-once when you are at a hospital or clinic, and once when you are not at a hospital or clinic. Record the average of the two measurements. To check your blood pressure when you are not at a hospital or clinic, you can use: ? An automated blood pressure machine at a pharmacy. ? A home blood pressure monitor.  If you are between 72 years and 70 years old, ask your health care provider if you should take aspirin to prevent strokes.  Have regular diabetes screenings. This involves taking a blood sample to check your fasting blood sugar level. ? If you are at a normal weight and have a low risk for diabetes, have this test once every three years after 43 years of age. ? If you are overweight and have a  high risk for diabetes, consider being tested at a younger age or more often. Preventing  infection Hepatitis B  If you have a higher risk for hepatitis B, you should be screened for this virus. You are considered at high risk for hepatitis B if: ? You were born in a country where hepatitis B is common. Ask your health care provider which countries are considered high risk. ? Your parents were born in a high-risk country, and you have not been immunized against hepatitis B (hepatitis B vaccine). ? You have HIV or AIDS. ? You use needles to inject street drugs. ? You live with someone who has hepatitis B. ? You have had sex with someone who has hepatitis B. ? You get hemodialysis treatment. ? You take certain medicines for conditions, including cancer, organ transplantation, and autoimmune conditions.  Hepatitis C  Blood testing is recommended for: ? Everyone born from [birth year] through 1967. ? Anyone with known risk factors for hepatitis C.  Sexually transmitted infections (STIs)  You should be screened for sexually transmitted infections (STIs) including gonorrhea and chlamydia if: ? You are sexually active and are younger than 44 years of age. ? You are older than 44 years of age and your health care provider tells you that you are at risk for this type of infection. ? Your sexual activity has changed since you were last screened and you are at an increased risk for chlamydia or gonorrhea. Ask your health care provider if you are at risk.  If you do not have HIV, but are at risk, it may be recommended that you take a prescription medicine daily to prevent HIV infection. This is called pre-exposure prophylaxis (PrEP). You are considered at risk if: ? You are sexually active and do not regularly use condoms or know the HIV status of your partner(s). ? You take drugs by injection. ? You are sexually active with a partner who has HIV.  Talk with your health care provider about whether you are at high risk of being infected with HIV. If you choose to begin PrEP, you should first be  tested for HIV. You should then be tested every 3 months for as long as you are taking PrEP. Pregnancy  If you are premenopausal and you may become pregnant, ask your health care provider about preconception counseling.  If you may become pregnant, take 400 to 800 micrograms (mcg) of folic acid every day.  If you want to prevent pregnancy, talk to your health care provider about birth control (contraception). Osteoporosis and menopause  Osteoporosis is a disease in which the bones lose minerals and strength with aging. This can result in serious bone fractures. Your risk for osteoporosis can be identified using a bone density scan.  If you are 53 years of age or older, or if you are at risk for osteoporosis and fractures, ask your health care provider if you should be screened.  Ask your health care provider whether you should take a calcium or vitamin D supplement to lower your risk for osteoporosis.  Menopause may have certain physical symptoms and risks.  Hormone replacement therapy may reduce some of these symptoms and risks. Talk to your health care provider about whether hormone replacement therapy is right for you. Follow these instructions at home:  Schedule regular health, dental, and eye exams.  Stay current with your immunizations.  Do not use any tobacco products including cigarettes, chewing tobacco, or electronic cigarettes.  If you are pregnant, do not  drink alcohol.  If you are breastfeeding, limit how much and how often you drink alcohol.  Limit alcohol intake to no more than 1 drink per day for nonpregnant women. One drink equals 12 ounces of beer, 5 ounces of wine, or 1 ounces of hard liquor.  Do not use street drugs.  Do not share needles.  Ask your health care provider for help if you need support or information about quitting drugs.  Tell your health care provider if you often feel depressed.  Tell your health care provider if you have ever been abused  or do not feel safe at home. This information is not intended to replace advice given to you by your health care provider. Make sure you discuss any questions you have with your health care provider. Document Released: 10/07/2010 Document Revised: 08/30/2015 Document Reviewed: 12/26/2014 Elsevier Interactive Patient Education  2018 [employer]     Agustina Caroli, MD [facility]

## 2017-04-21 NOTE — Patient Instructions (Addendum)
   IF you received an x-ray today, you will receive an invoice from [facility]. Please contact [facility] at [phone] with questions or concerns regarding your invoice.   IF you received labwork today, you will receive an invoice from [facility]. Please contact [facility] at [phone] with questions or concerns regarding your invoice.   Our billing staff will not be able to assist you with questions regarding bills from these companies.  You will be contacted with the lab results as soon as they are available. The fastest way to get your results is to activate your My Chart account. Instructions are located on the last page of this paperwork. If you have not heard from us regarding the results in 2 weeks, please contact this office.    Health Maintenance, Female Adopting a healthy lifestyle and getting preventive care can go a long way to promote health and wellness. Talk with your health care provider about what schedule of regular examinations is right for you. This is a good chance for you to check in with your provider about disease prevention and staying healthy. In between checkups, there are plenty of things you can do on your own. Experts have done a lot of research about which lifestyle changes and preventive measures are most likely to keep you healthy. Ask your health care provider for more information. Weight and diet Eat a healthy diet  Be sure to include plenty of vegetables, fruits, low-fat dairy products, and lean protein.  Do not eat a lot of foods high in solid fats, added sugars, or salt.  Get regular exercise. This is one of the most important things you can do for your health. ? Most adults should exercise for at least 150 minutes each week. The exercise should increase your heart rate and make you sweat (moderate-intensity exercise). ? Most adults should also do strengthening exercises at least twice a week. This is in addition to the  moderate-intensity exercise.  Maintain a healthy weight  Body mass index (BMI) is a measurement that can be used to identify possible weight problems. It estimates body fat based on height and weight. Your health care provider can help determine your BMI and help you achieve or maintain a healthy weight.  For females 20 years of age and older: ? A BMI below 18.5 is considered underweight. ? A BMI of 18.5 to 24.9 is normal. ? A BMI of 25 to 29.9 is considered overweight. ? A BMI of 30 and above is considered obese.  Watch levels of cholesterol and blood lipids  You should start having your blood tested for lipids and cholesterol at 44 years of age, then have this test every 5 years.  You may need to have your cholesterol levels checked more often if: ? Your lipid or cholesterol levels are high. ? You are older than 44 years of age. ? You are at high risk for heart disease.  Cancer screening Lung Cancer  Lung cancer screening is recommended for adults 55-80 years old who are at high risk for lung cancer because of a history of smoking.  A yearly low-dose CT scan of the lungs is recommended for people who: ? Currently smoke. ? Have quit within the past 15 years. ? Have at least a 30-pack-year history of smoking. A pack year is smoking an average of one pack of cigarettes a day for 1 year.  Yearly screening should continue until it has been 15 years since you quit.  Yearly screening   should stop if you develop a health problem that would prevent you from having lung cancer treatment.  Breast Cancer  Practice breast self-awareness. This means understanding how your breasts normally appear and feel.  It also means doing regular breast self-exams. Let your health care provider know about any changes, no matter how small.  If you are in your 20s or 30s, you should have a clinical breast exam (CBE) by a health care provider every 1-3 years as part of a regular health exam.  If you  are 42 or older, have a CBE every year. Also consider having a breast X-ray (mammogram) every year.  If you have a family history of breast cancer, talk to your health care provider about genetic screening.  If you are at high risk for breast cancer, talk to your health care provider about having an MRI and a mammogram every year.  Breast cancer gene (BRCA) assessment is recommended for women who have family members with BRCA-related cancers. BRCA-related cancers include: ? Breast. ? Ovarian. ? Tubal. ? Peritoneal cancers.  Results of the assessment will determine the need for genetic counseling and BRCA1 and BRCA2 testing.  Cervical Cancer Your health care provider may recommend that you be screened regularly for cancer of the pelvic organs (ovaries, uterus, and vagina). This screening involves a pelvic examination, including checking for microscopic changes to the surface of your cervix (Pap test). You may be encouraged to have this screening done every 3 years, beginning at age 56.  For women ages 41-65, health care providers may recommend pelvic exams and Pap testing every 3 years, or they may recommend the Pap and pelvic exam, combined with testing for human papilloma virus (HPV), every 5 years. Some types of HPV increase your risk of cervical cancer. Testing for HPV may also be done on women of any age with unclear Pap test results.  Other health care providers may not recommend any screening for nonpregnant women who are considered low risk for pelvic cancer and who do not have symptoms. Ask your health care provider if a screening pelvic exam is right for you.  If you have had past treatment for cervical cancer or a condition that could lead to cancer, you need Pap tests and screening for cancer for at least 20 years after your treatment. If Pap tests have been discontinued, your risk factors (such as having a new sexual partner) need to be reassessed to determine if screening should  resume. Some women have medical problems that increase the chance of getting cervical cancer. In these cases, your health care provider may recommend more frequent screening and Pap tests.  Colorectal Cancer  This type of cancer can be detected and often prevented.  Routine colorectal cancer screening usually begins at 44 years of age and continues through 44 years of age.  Your health care provider may recommend screening at an earlier age if you have risk factors for colon cancer.  Your health care provider may also recommend using home test kits to check for hidden blood in the stool.  A small camera at the end of a tube can be used to examine your colon directly (sigmoidoscopy or colonoscopy). This is done to check for the earliest forms of colorectal cancer.  Routine screening usually begins at age 16.  Direct examination of the colon should be repeated every 5-10 years through 44 years of age. However, you may need to be screened more often if early forms of precancerous polyps or  small growths are found.  Skin Cancer  Check your skin from head to toe regularly.  Tell your health care provider about any new moles or changes in moles, especially if there is a change in a mole's shape or color.  Also tell your health care provider if you have a mole that is larger than the size of a pencil eraser.  Always use sunscreen. Apply sunscreen liberally and repeatedly throughout the day.  Protect yourself by wearing long sleeves, pants, a wide-brimmed hat, and sunglasses whenever you are outside.  Heart disease, diabetes, and high blood pressure  High blood pressure causes heart disease and increases the risk of stroke. High blood pressure is more likely to develop in: ? People who have blood pressure in the high end of the normal range (130-139/85-89 mm Hg). ? People who are overweight or obese. ? People who are African American.  If you are 61-36 years of age, have your blood  pressure checked every 3-5 years. If you are 6 years of age or older, have your blood pressure checked every year. You should have your blood pressure measured twice-once when you are at a hospital or clinic, and once when you are not at a hospital or clinic. Record the average of the two measurements. To check your blood pressure when you are not at a hospital or clinic, you can use: ? An automated blood pressure machine at a pharmacy. ? A home blood pressure monitor.  If you are between 7 years and 29 years old, ask your health care provider if you should take aspirin to prevent strokes.  Have regular diabetes screenings. This involves taking a blood sample to check your fasting blood sugar level. ? If you are at a normal weight and have a low risk for diabetes, have this test once every three years after 44 years of age. ? If you are overweight and have a high risk for diabetes, consider being tested at a younger age or more often. Preventing infection Hepatitis B  If you have a higher risk for hepatitis B, you should be screened for this virus. You are considered at high risk for hepatitis B if: ? You were born in a country where hepatitis B is common. Ask your health care provider which countries are considered high risk. ? Your parents were born in a high-risk country, and you have not been immunized against hepatitis B (hepatitis B vaccine). ? You have HIV or AIDS. ? You use needles to inject street drugs. ? You live with someone who has hepatitis B. ? You have had sex with someone who has hepatitis B. ? You get hemodialysis treatment. ? You take certain medicines for conditions, including cancer, organ transplantation, and autoimmune conditions.  Hepatitis C  Blood testing is recommended for: ? Everyone born from [birth year] through 1967. ? Anyone with known risk factors for hepatitis C.  Sexually transmitted infections (STIs)  You should be screened for sexually transmitted  infections (STIs) including gonorrhea and chlamydia if: ? You are sexually active and are younger than 44 years of age. ? You are older than 44 years of age and your health care provider tells you that you are at risk for this type of infection. ? Your sexual activity has changed since you were last screened and you are at an increased risk for chlamydia or gonorrhea. Ask your health care provider if you are at risk.  If you do not have HIV, but are at risk, it  may be recommended that you take a prescription medicine daily to prevent HIV infection. This is called pre-exposure prophylaxis (PrEP). You are considered at risk if: ? You are sexually active and do not regularly use condoms or know the HIV status of your partner(s). ? You take drugs by injection. ? You are sexually active with a partner who has HIV.  Talk with your health care provider about whether you are at high risk of being infected with HIV. If you choose to begin PrEP, you should first be tested for HIV. You should then be tested every 3 months for as long as you are taking PrEP. Pregnancy  If you are premenopausal and you may become pregnant, ask your health care provider about preconception counseling.  If you may become pregnant, take 400 to 800 micrograms (mcg) of folic acid every day.  If you want to prevent pregnancy, talk to your health care provider about birth control (contraception). Osteoporosis and menopause  Osteoporosis is a disease in which the bones lose minerals and strength with aging. This can result in serious bone fractures. Your risk for osteoporosis can be identified using a bone density scan.  If you are 65 years of age or older, or if you are at risk for osteoporosis and fractures, ask your health care provider if you should be screened.  Ask your health care provider whether you should take a calcium or vitamin D supplement to lower your risk for osteoporosis.  Menopause may have certain physical  symptoms and risks.  Hormone replacement therapy may reduce some of these symptoms and risks. Talk to your health care provider about whether hormone replacement therapy is right for you. Follow these instructions at home:  Schedule regular health, dental, and eye exams.  Stay current with your immunizations.  Do not use any tobacco products including cigarettes, chewing tobacco, or electronic cigarettes.  If you are pregnant, do not drink alcohol.  If you are breastfeeding, limit how much and how often you drink alcohol.  Limit alcohol intake to no more than 1 drink per day for nonpregnant women. One drink equals 12 ounces of beer, 5 ounces of wine, or 1 ounces of hard liquor.  Do not use street drugs.  Do not share needles.  Ask your health care provider for help if you need support or information about quitting drugs.  Tell your health care provider if you often feel depressed.  Tell your health care provider if you have ever been abused or do not feel safe at home. This information is not intended to replace advice given to you by your health care provider. Make sure you discuss any questions you have with your health care provider. Document Released: 10/07/2010 Document Revised: 08/30/2015 Document Reviewed: 12/26/2014 Elsevier Interactive Patient Education  2018 [employer]vier Inc.  

## 2017-04-22 LAB — CBC WITH DIFFERENTIAL/PLATELET
BASOS ABS: 0 10*3/uL (ref 0.0–0.2)
Basos: 0 %
EOS (ABSOLUTE): 0.1 10*3/uL (ref 0.0–0.4)
Eos: 1 %
Hematocrit: 41.3 % (ref 34.0–46.6)
Hemoglobin: 13.9 g/dL (ref 11.1–15.9)
IMMATURE GRANS (ABS): 0 10*3/uL (ref 0.0–0.1)
IMMATURE GRANULOCYTES: 0 %
LYMPHS: 26 %
Lymphocytes Absolute: 1.9 10*3/uL (ref 0.7–3.1)
MCH: 28.8 pg (ref 26.6–33.0)
MCHC: 33.7 g/dL (ref 31.5–35.7)
MCV: 86 fL (ref 79–97)
MONOS ABS: 0.3 10*3/uL (ref 0.1–0.9)
Monocytes: 4 %
NEUTROS PCT: 69 %
Neutrophils Absolute: 5.2 10*3/uL (ref 1.4–7.0)
PLATELETS: 292 10*3/uL (ref 150–379)
RBC: 4.83 x10E6/uL (ref 3.77–5.28)
RDW: 13.7 % (ref 12.3–15.4)
WBC: 7.5 10*3/uL (ref 3.4–10.8)

## 2017-04-22 LAB — COMPREHENSIVE METABOLIC PANEL
A/G RATIO: 1.5 (ref 1.2–2.2)
ALT: 20 IU/L (ref 0–32)
AST: 17 IU/L (ref 0–40)
Albumin: 4.3 g/dL (ref 3.5–5.5)
Alkaline Phosphatase: 79 IU/L (ref 39–117)
BUN/Creatinine Ratio: 17 (ref 9–23)
BUN: 10 mg/dL (ref 6–24)
Bilirubin Total: 0.3 mg/dL (ref 0.0–1.2)
CALCIUM: 9.1 mg/dL (ref 8.7–10.2)
CHLORIDE: 103 mmol/L (ref 96–106)
CO2: 22 mmol/L (ref 20–29)
Creatinine, Ser: 0.58 mg/dL (ref 0.57–1.00)
GFR, EST AFRICAN AMERICAN: 131 mL/min/{1.73_m2} (ref >59)
GFR, EST NON AFRICAN AMERICAN: 113 mL/min/{1.73_m2} (ref >59)
GLUCOSE: 87 mg/dL (ref 65–99)
Globulin, Total: 2.8 g/dL (ref 1.5–4.5)
POTASSIUM: 4.3 mmol/L (ref 3.5–5.2)
Sodium: 141 mmol/L (ref 134–144)
TOTAL PROTEIN: 7.1 g/dL (ref 6.0–8.5)

## 2017-04-22 LAB — THYROID PANEL
FREE THYROXINE INDEX: 1.7 (ref 1.2–4.9)
T3 UPTAKE RATIO: 26 % (ref 24–39)
T4, Total: 6.5 ug/dL (ref 4.5–12.0)

## 2017-04-22 LAB — ANA,IFA RA DIAG PNL W/RFLX TIT/PATN
ANA TITER 1: POSITIVE — AB
CYCLIC CITRULLIN PEPTIDE AB: 7 U (ref 0–19)

## 2017-04-22 LAB — FANA STAINING PATTERNS

## 2017-04-24 ENCOUNTER — Other Ambulatory Visit: Payer: Self-pay | Admitting: Emergency Medicine

## 2017-04-24 ENCOUNTER — Telehealth: Payer: Self-pay | Admitting: Emergency Medicine

## 2017-04-24 DIAGNOSIS — R768 Other specified abnormal immunological findings in serum: Secondary | ICD-10-CM

## 2017-04-24 NOTE — Telephone Encounter (Signed)
Lab results an physician's note reviewed with patient and her husband. Noted they understood a referral was made and a letter was sent to them with this information.

## 2017-04-25 ENCOUNTER — Encounter: Payer: Self-pay | Admitting: Radiology

## 2017-04-30 DIAGNOSIS — H11152 Pinguecula, left eye: Secondary | ICD-10-CM | POA: Diagnosis not present

## 2017-05-12 DIAGNOSIS — J310 Chronic rhinitis: Secondary | ICD-10-CM | POA: Diagnosis not present

## 2017-06-09 DIAGNOSIS — J342 Deviated nasal septum: Secondary | ICD-10-CM | POA: Diagnosis not present

## 2017-06-09 DIAGNOSIS — J310 Chronic rhinitis: Secondary | ICD-10-CM | POA: Diagnosis not present

## 2017-06-09 DIAGNOSIS — J3501 Chronic tonsillitis: Secondary | ICD-10-CM | POA: Diagnosis not present
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
## 2017-06-25 ENCOUNTER — Ambulatory Visit (INDEPENDENT_AMBULATORY_CARE_PROVIDER_SITE_OTHER): Payer: Self-pay

## 2017-06-25 ENCOUNTER — Encounter: Payer: Self-pay | Admitting: Rheumatology

## 2017-06-25 ENCOUNTER — Ambulatory Visit: Payer: BLUE CROSS/BLUE SHIELD | Admitting: Rheumatology

## 2017-06-25 VITALS — BP 102/60 | HR 60 | Resp 17 | Ht 62.21 in | Wt 162.0 lb

## 2017-06-25 DIAGNOSIS — R531 Weakness: Secondary | ICD-10-CM

## 2017-06-25 DIAGNOSIS — M545 Low back pain: Secondary | ICD-10-CM | POA: Diagnosis not present

## 2017-06-25 DIAGNOSIS — M25562 Pain in left knee: Secondary | ICD-10-CM

## 2017-06-25 DIAGNOSIS — Z832 Family history of diseases of the blood and blood-forming organs and certain disorders involving the immune mechanism: Secondary | ICD-10-CM

## 2017-06-25 DIAGNOSIS — M25561 Pain in right knee: Secondary | ICD-10-CM

## 2017-06-25 DIAGNOSIS — M79641 Pain in right hand: Secondary | ICD-10-CM

## 2017-06-25 DIAGNOSIS — Z8709 Personal history of other diseases of the respiratory system: Secondary | ICD-10-CM

## 2017-06-25 DIAGNOSIS — G8929 Other chronic pain: Secondary | ICD-10-CM | POA: Diagnosis not present

## 2017-06-25 DIAGNOSIS — R5383 Other fatigue: Secondary | ICD-10-CM

## 2017-06-25 DIAGNOSIS — R768 Other specified abnormal immunological findings in serum: Secondary | ICD-10-CM

## 2017-06-25 DIAGNOSIS — M79642 Pain in left hand: Secondary | ICD-10-CM

## 2017-06-25 DIAGNOSIS — Z8269 Family history of other diseases of the musculoskeletal system and connective tissue: Secondary | ICD-10-CM

## 2017-06-25 DIAGNOSIS — Z8639 Personal history of other endocrine, nutritional and metabolic disease: Secondary | ICD-10-CM

## 2017-06-29 LAB — URINALYSIS, ROUTINE W REFLEX MICROSCOPIC
Bilirubin Urine: NEGATIVE
Glucose, UA: NEGATIVE
Hgb urine dipstick: NEGATIVE
KETONES UR: NEGATIVE
Leukocytes, UA: NEGATIVE
NITRITE: NEGATIVE
Protein, ur: NEGATIVE
SPECIFIC GRAVITY, URINE: 1.022 (ref 1.001–1.03)
pH: <=5 (ref 5.0–8.0)

## 2017-06-29 LAB — LUPUS ANTICOAGULANT EVAL W/ REFLEX
PTT LA SCREEN: 40 s (ref <=40)
dRVVT Screen: 41 s (ref <=45)

## 2017-06-29 LAB — PROTEIN ELECTROPHORESIS, SERUM, WITH REFLEX
Albumin ELP: 4.3 g/dL (ref 3.8–4.8)
Alpha 1: 0.3 g/dL (ref 0.2–0.3)
Alpha 2: 0.7 g/dL (ref 0.5–0.9)
Beta 2: 0.3 g/dL (ref 0.2–0.5)
Beta Globulin: 0.5 g/dL (ref 0.4–0.6)
GAMMA GLOBULIN: 1.1 g/dL (ref 0.8–1.7)
Total Protein: 7.2 g/dL (ref 6.1–8.1)

## 2017-06-29 LAB — ANTI-NUCLEAR AB-TITER (ANA TITER): ANA Titer 1: 1:320 {titer} — ABNORMAL HIGH

## 2017-06-29 LAB — ANTI-SMITH ANTIBODY: ENA SM Ab Ser-aCnc: <1 AI

## 2017-06-29 LAB — SJOGRENS SYNDROME-B EXTRACTABLE NUCLEAR ANTIBODY: SSB (La) (ENA) Antibody, IgG: <1 AI

## 2017-06-29 LAB — BETA-2 GLYCOPROTEIN ANTIBODIES: Beta-2 Glyco 1 IgA: <9 SAU (ref <=20)

## 2017-06-29 LAB — CK: Total CK: 115 U/L (ref 29–143)

## 2017-06-29 LAB — RNP ANTIBODY: RIBONUCLEIC PROTEIN(ENA) ANTIBODY, IGG: NEGATIVE AI

## 2017-06-29 LAB — ANTI-DNA ANTIBODY, DOUBLE-STRANDED: ds DNA Ab: 1 IU/mL

## 2017-06-29 LAB — RHEUMATOID FACTOR

## 2017-06-29 LAB — ANTI-SCLERODERMA ANTIBODY: SCLERODERMA (SCL-70) (ENA) ANTIBODY, IGG: NEGATIVE AI

## 2017-06-29 LAB — CARDIOLIPIN ANTIBODIES, IGG, IGM, IGA
ANTICARDIOLIPIN IGM: 72 [MPL'U] — AB
Anticardiolipin IgA: <11 [APL'U]

## 2017-06-29 LAB — ANA: ANA: POSITIVE — AB

## 2017-06-29 LAB — CYCLIC CITRUL PEPTIDE ANTIBODY, IGG

## 2017-06-29 LAB — C3 AND C4
C3 Complement: 104 mg/dL (ref 83–193)
C4 Complement: 14 mg/dL — ABNORMAL LOW (ref 15–57)

## 2017-06-29 LAB — SEDIMENTATION RATE: SED RATE: 14 mm/h (ref 0–20)

## 2017-06-29 LAB — GLUCOSE 6 PHOSPHATE DEHYDROGENASE: G-6PDH: 14.2 U/g Hgb (ref 7.0–20.5)

## 2017-06-29 LAB — SJOGRENS SYNDROME-A EXTRACTABLE NUCLEAR ANTIBODY: SSA (Ro) (ENA) Antibody, IgG: <1 AI

## 2017-06-29 NOTE — Progress Notes (Signed)
Okay to discuss at follow-up visit

## 2017-07-10 DIAGNOSIS — J329 Chronic sinusitis, unspecified: Secondary | ICD-10-CM | POA: Diagnosis not present

## 2017-07-10 DIAGNOSIS — J342 Deviated nasal septum: Secondary | ICD-10-CM | POA: Diagnosis not present

## 2017-07-10 DIAGNOSIS — H9202 Otalgia, left ear: Secondary | ICD-10-CM | POA: Diagnosis not present

## 2017-07-17 DIAGNOSIS — M19042 Primary osteoarthritis, left hand: Secondary | ICD-10-CM

## 2017-07-17 DIAGNOSIS — G8929 Other chronic pain: Secondary | ICD-10-CM | POA: Insufficient documentation

## 2017-07-17 DIAGNOSIS — M170 Bilateral primary osteoarthritis of knee: Secondary | ICD-10-CM | POA: Insufficient documentation

## 2017-07-17 DIAGNOSIS — M545 Low back pain: Secondary | ICD-10-CM

## 2017-07-17 DIAGNOSIS — R768 Other specified abnormal immunological findings in serum: Secondary | ICD-10-CM | POA: Insufficient documentation

## 2017-07-17 DIAGNOSIS — M19041 Primary osteoarthritis, right hand: Secondary | ICD-10-CM | POA: Insufficient documentation

## 2017-07-17 NOTE — Progress Notes (Signed)
Office Visit Note  Patient: Tara Caldwell             Date of Birth: December 09, 1975           MRN: [id number]             PCP: Horald Pollen, MD Referring: Cathleen Corti, PA-C Visit Date: 07/29/2017 Occupation: '@GUAROCC'$ @   Interpreter: Dionicio Stall  Subjective:    History of Present Illness: Tara Caldwell is a 42 y.o. female returns for follow-up visit today.  She states she continues to have pain and stiffness in her bilateral hands.  She also continues to have fatigue.  Her knee joints are not as bothersome currently.  She has lower back pain off and on.  Activities of Daily Living:  Patient reports morning stiffness for 5  hours.   Patient Reports nocturnal pain.  Difficulty dressing/grooming: Denies Difficulty climbing stairs: Reports Difficulty getting out of chair: Reports Difficulty using hands for taps, buttons, cutlery, and/or writing: Denies   Review of Systems  Constitutional: Positive for fatigue.  HENT: Positive for mouth dryness. Negative for mouth sores, trouble swallowing, trouble swallowing and nose dryness.   Eyes: Positive for dryness. Negative for pain and visual disturbance.  Respiratory: Negative for cough, hemoptysis, shortness of breath and difficulty breathing.   Cardiovascular: Negative for chest pain, palpitations, hypertension and swelling in legs/feet.  Gastrointestinal: Positive for constipation and diarrhea. Negative for blood in stool.  Endocrine: Negative for increased urination.  Genitourinary: Negative for painful urination.  Musculoskeletal: Positive for arthralgias, joint pain, joint swelling and morning stiffness. Negative for myalgias, muscle weakness, muscle tenderness and myalgias.  Skin: Positive for rash. Negative for color change, pallor, hair loss, nodules/bumps, skin tightness, ulcers and sensitivity to sunlight.  Allergic/Immunologic: Negative for susceptible to infections.  Neurological:  Negative for dizziness, numbness, headaches and weakness.  Hematological: Negative for swollen glands.  Psychiatric/Behavioral: Positive for sleep disturbance. Negative for depressed mood. The patient is not nervous/anxious.     PMFS History:  Patient Active Problem List   Diagnosis Date Noted  . Primary osteoarthritis of both knees 07/17/2017  . Primary osteoarthritis of both hands 07/17/2017  . Chronic midline low back pain without sciatica 07/17/2017  . Positive ANA (antinuclear antibody) 07/17/2017  . History of thyroid disorder 04/21/2017  . Viral upper respiratory tract infection 04/21/2017  . Sinus headache 04/21/2017  . General weakness 04/21/2017  . Family history of systemic lupus erythematosus (SLE) in mother 04/21/2017    Past Medical History:  Diagnosis Date  . Asthma     Family History  Problem Relation Age of Onset  . Lupus Mother   . Heart disease Father   . Heart attack Father   . Thyroid disease Sister   . Heart disease Maternal Grandmother    History reviewed. No pertinent surgical history. Social History   Social History Narrative  . Not on file     Objective: Vital Signs: BP 113/73 (BP Location: Left Arm, Patient Position: Sitting, Cuff Size: Normal)   Pulse 61   Resp 16   Ht 5' 2.6" (1.59 m)   Wt 157 lb (71.2 kg)   BMI 28.17 kg/m    Physical Exam  Constitutional: She is oriented to person, place, and time. She appears well-developed and well-nourished.  HENT:  Head: Normocephalic and atraumatic.  Eyes: Conjunctivae and EOM are normal.  Neck: Normal range of motion.  Cardiovascular: Normal rate, regular rhythm, normal heart sounds and intact  distal pulses.  Pulmonary/Chest: Effort normal and breath sounds normal.  Abdominal: Soft. Bowel sounds are normal.  Lymphadenopathy:    She has no cervical adenopathy.  Neurological: She is alert and oriented to person, place, and time.  Skin: Skin is warm and dry. Capillary refill takes less than 2  seconds.  Psychiatric: She has a normal mood and affect. Her behavior is normal.  Nursing note and vitals reviewed.    Musculoskeletal Exam: C-spine thoracic lumbar spine good range of motion.  Shoulder joints of the joints wrist joint MCPs.  History of is been good range of motion.  She has some tenderness across the PIP joints but no synovitis was noted.  Hip joints knee joints ankles MTPs PIPs been good range of motion with no synovitis.  She is crepitus with range of motion of bilateral knee joints.  CDAI Exam: No CDAI exam completed.    Investigation: No additional findings.  June 25, 2017 UA negative, CK 115, ESR 14, RF negative, anti-CCP negative, ANA 1: 320 homogeneous, ENA negative, C3 normal, C4 low, anticardiolipin IgM 72, beta-2 GP 1-, lupus anticoagulant negative, SPEP normal, G6PD normal Imaging: No results found.  Speciality Comments: No specialty comments available.    Procedures:  No procedures performed Allergies: Poison ivy extract   Assessment / Plan:     Visit Diagnoses: Positive ANA (antinuclear antibody) - ANA 1: 320 homogeneous, C4 low, anticardiolipin IgM 72, fatigue, arthralgias,facial erythema, hair loss.  She most likely have some autoimmune disease.  I cannot categorize yet.  We had detailed discussion with patient regarding her symptoms.  She states she is unable to sleep due to discomfort and has difficulty doing routine activities.  We discussed to give her a trial of Plaquenil.  Indications side effects contraindications were discussed at length.  Handout was given and consent was taken.  I have advised her to get baseline eye exam and then yearly eye exam.  She will need labs in a month and then every 3 months to monitor for drug toxicity.  She will be started on Plaquenil 200 mg p.o. twice daily Monday to Friday.  Patient was counseled on the purpose, proper use, and adverse effects of hydroxychloroquine including nausea/diarrhea, skin rash, headaches,  and sun sensitivity.  Discussed importance of annual eye exams while on hydroxychloroquine to monitor to ocular toxicity and discussed importance of frequent laboratory monitoring.  Provided patient with eye exam form for baseline ophthalmologic exam.  Provided patient with educational materials on hydroxychloroquine and answered all questions.  Patient consented to hydroxychloroquine.  Will upload consent in the media tab.     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   Orders: No orders of the defined types were placed in this encounter.  Meds ordered this encounter  Medications  . hydroxychloroquine (PLAQUENIL) 200 MG tablet    Sig: Take '200mg'$  twice daily, Monday-Friday only. None on Saturday or Sunday.    Dispense:  40 tablet    Refill:  0    Face-to-face time spent with patient was 30 minutes.  Greater than 50% of time was spent in counseling and coordination of care.  Follow-Up Instructions: Return in about 3 months (around 10/28/2017) for Autoimmune disease.  Bo Merino, MD  Note - This record has been created using Editor, commissioning.  Chart creation errors have been sought, but may not always  have been located. Such creation errors do not reflect on  the standard of medical care.

## 2017-07-29 ENCOUNTER — Ambulatory Visit: Payer: BLUE CROSS/BLUE SHIELD | Admitting: Rheumatology

## 2017-07-29 ENCOUNTER — Encounter: Payer: Self-pay | Admitting: Rheumatology

## 2017-07-29 VITALS — BP 113/73 | HR 61 | Resp 16 | Ht 62.6 in | Wt 157.0 lb

## 2017-07-29 DIAGNOSIS — Z8269 Family history of other diseases of the musculoskeletal system and connective tissue: Secondary | ICD-10-CM | POA: Diagnosis not present

## 2017-07-29 DIAGNOSIS — R768 Other specified abnormal immunological findings in serum: Secondary | ICD-10-CM | POA: Diagnosis not present

## 2017-07-29 DIAGNOSIS — M19041 Primary osteoarthritis, right hand: Secondary | ICD-10-CM

## 2017-07-29 DIAGNOSIS — G8929 Other chronic pain: Secondary | ICD-10-CM

## 2017-07-29 DIAGNOSIS — M19042 Primary osteoarthritis, left hand: Secondary | ICD-10-CM | POA: Diagnosis not present

## 2017-07-29 DIAGNOSIS — M170 Bilateral primary osteoarthritis of knee: Secondary | ICD-10-CM | POA: Diagnosis not present

## 2017-07-29 DIAGNOSIS — Z8639 Personal history of other endocrine, nutritional and metabolic disease: Secondary | ICD-10-CM

## 2017-07-29 DIAGNOSIS — M545 Low back pain: Secondary | ICD-10-CM | POA: Diagnosis not present

## 2017-07-29 MED ORDER — HYDROXYCHLOROQUINE SULFATE 200 MG PO TABS: ORAL_TABLET | ORAL | 0 refills | Status: DC

## 2017-07-29 NOTE — Patient Instructions (Signed)
Hydroxychloroquine tablets Qu es este medicamento? La HIDROXICLOROQUINA se utiliza para tratar la artritis reumatoide y el lupus eritematoso sistmico. A veces tambin se utiliza para tratar la malaria. Este medicamento puede ser utilizado para otros usos; si tiene alguna pregunta consulte con su proveedor de atencin mdica o con su farmacutico. MARCAS COMUNES: Plaquenil, Quineprox Qu le debo informar a mi profesional de la salud antes de tomar este medicamento? Necesitan saber si usted presenta alguno de los [employer] o situaciones: diabetes enfermedad ocular, problemas de la visin deficiencia de G6PD antecedentes de enfermedades de la sangre antecedentes de ritmo cardiaco irregular si bebe alcohol con frecuencia enfermedad renal enfermedad heptica porfiria psoriasis convulsiones una reaccin alrgica o inusual a la cloroquina, a la hidroxicloroquina, a otros medicamentos, alimentos, colorantes o conservantes si est embarazada o buscando quedar embarazada si est amamantando a un beb Cmo debo utilizar este medicamento? Tome este medicamento por va oral con un vaso de agua. Siga las instrucciones de la etiqueta del [place]. Evite tomar anticidos dentro de las 4 horas de tomar Coca[employer]Cola. Es mejor que haya una separacin de al menos 4 horas entre estos medicamentos. No corte, triture ni [employer]. Puede tomarlo con o sin alimentos. Si el Transport planner, tmelo con alimentos. Tome su medicamento a intervalos regulares. No tome su medicamento con una frecuencia mayor a la indicada. Complete todas las dosis de su medicamento como se le haya indicado, aun si se siente mejor. No omita ninguna dosis ni suspenda el uso de su medicamento antes de lo indicado. Hable con su pediatra para informarse acerca del uso de este medicamento en nios. Aunque este medicamento se puede Advertising account executive, existen precauciones que deben  tomarse. Sobredosis: Pngase en contacto inmediatamente con un centro toxicolgico o una sala de urgencia si usted cree que haya tomado demasiado medicamento. ATENCIN: [employer] es solo para usted. No comparta este medicamento con nadie. Qu sucede si me olvido de una dosis? Si olvida una dosis, tmela lo antes posible. Si es casi la hora de la prxima dosis, tome slo esa dosis. No tome dosis adicionales o dobles. Qu puede interactuar con este medicamento? No tome este medicamento con ninguno de los siguientes frmacos: cisaprida dofetilida dronedarona vacunas de virus vivos penicilamina pimozida tioridazina ziprasidona Este medicamento tambin puede interactuar con los siguientes medicamentos: ampicilina anticidos cimetidina ciclosporina digoxina medicamentos para la diabetes, tales como [place], glipizida, gliburida medicamentos para las convulsiones, tales como [place], fenobarbital, Engineer, agricultural metotrexato otros medicamentos que prolongan el intervalo QT (causan un ritmo cardiaco anormal) praziquantel Puede ser que esta lista no menciona todas las posibles interacciones. Informe a su profesional de [employer]	[place] de [employer] productos a base de hierbas, medicamentos de [place] o suplementos nutritivos que est tomando. Si usted fuma, consume bebidas alcohlicas o si utiliza drogas ilegales, indqueselo tambin a su profesional de [employer]	[place]. Algunas sustancias pueden interactuar con su medicamento. A qu debo estar atento al usar Coca-Cola? Informe a su mdico o a su profesional de la salud si sus sntomas no comienzan a mejorar o si empeoran. Evite tomar anticidos dentro de las 4 horas de tomar Coca-Cola. Es mejor que haya una separacin de al menos 4 horas entre estos medicamentos. Si observa algn cambio en la vista, infrmelo de inmediato a su mdico o su profesional de [employer]	[place]. Es posible que se le realicen pruebas de visin y de sangre antes y  durante el uso de [place].  Este medicamento puede aumentar su sensibilidad al sol. Evite la [employer]. Si no la Product manager, utilice ropa protectora y crema de Photographer. No utilice lmparas solares, camas solares ni cabinas solares. Qu efectos secundarios puedo tener al [employer] este medicamento? Efectos secundarios que debe informar a su mdico o a Barrister's clerk de la salud tan pronto como sea posible: Chief of Staff, como erupcin cutnea, comezn/picazn o urticarias, e hinchazn de la cara, los labios o la lengua cambios en la visin disminucin de la audicin o zumbido en los odos enrojecimiento, formacin de ampollas, descamacin o distensin de la piel, incluso dentro de la boca convulsiones sensibilidad a la luz signos y sntomas de un cambio peligroso en el pulso o ritmo cardiaco, tales como dolor en el pecho; mareos; ritmo cardiaco rpido o irregular; palpitaciones; sensacin de desmayos o aturdimiento, cadas; problemas respiratorios signos y sntomas de lesin al hgado, como orina amarilla oscura o [place]; sensacin general de estar enfermo o sntomas gripales; heces claras; prdida de apetito; nuseas; dolor en la regin abdominal superior derecha; cansancio o debilidad inusual; color amarillento de los ojos o la piel signos y sntomas de niveles bajos de Location manager en la sangre, tales como ansiedad; confusin; Tree surgeon; aumento del apetito; debilidad o cansancio inusual; sudoracin; temblores; fro; irritabilidad; dolor de cabeza; visin borrosa; ritmo cardiaco rpido; prdida del conocimiento movimientos incontrolables de la cabeza, la boca, el cuello, los brazos o las piernas Efectos secundarios que generalmente no requieren atencin mdica (infrmelos a su mdico o a Barrister's clerk de la salud si persisten o si son molestos): ansiedad diarrea mareos cada del cabello dolor de cabeza irritabilidad prdida del apetito nuseas, vmito dolor estomacal Puede ser que esta  lista no menciona todos los posibles efectos secundarios. Comunquese a su mdico por asesoramiento mdico [employer]. Usted puede informar los efectos secundarios a la FDA por telfono al 1-800-FDA-1088. Dnde debo guardar mi medicina? Mantngala fuera del alcance de los nios. Para los nios, este medicamento puede causar una sobredosis con dosis pequeas. Gurdela a [employer], entre 15 y 20 grados C (79 y 34 grados F). Protjala de la luz y de la humedad. Deseche todo medicamento sin utilizar despus de su fecha de vencimiento. ATENCIN: Este folleto es un resumen. Puede ser que no cubra toda la posible informacin. Si usted tiene preguntas acerca de esta medicina, consulte con su mdico, su farmacutico o su profesional de Technical sales engineer.  2018 Elsevier/Gold Standard (2016-04-24 00:00:00) Standing Labs We placed an order today for your standing lab work.    Please come back and get your standing labs in 1 month and every 3 months  We have open lab Monday through Friday from 8:30-11:30 AM and 1:30-4:00 PM  at the office of Dr. Bo Merino.   You may experience shorter wait times on Monday and Friday afternoons. The office is located at [address], [place], [place], [place] [address] No appointment is necessary.   Labs are drawn by [facility].  You may receive a bill from [facility] for your lab work. If you have any questions regarding directions or hours of operation,  please call [phone].

## 2017-10-13 ENCOUNTER — Ambulatory Visit: Payer: BLUE CROSS/BLUE SHIELD | Admitting: Gynecology

## 2017-10-14 ENCOUNTER — Other Ambulatory Visit: Payer: Self-pay | Admitting: Rheumatology

## 2017-10-15 NOTE — Telephone Encounter (Addendum)
Last Visit:07/29/17 Next Visit 10/21/17 Labs: 04/21/17 cbc/cmp WNL Patient has not had baseline PLQ eye exam. Patient has been made aware she needs to schedule appointment.  Okay to refill per Dr. Estanislado Pandy

## 2017-10-19 NOTE — Progress Notes (Signed)
Office Visit Note  Patient: Tara Caldwell             Date of Birth: [birth year]           MRN: [id number]             PCP: Horald Pollen, MD Referring: Horald Pollen, * Visit Date: 10/22/2017 Occupation: @GUAROCC @  Interpreter: Deetta Perla Subjective:  Generalize pain and fatigue   History of Present Illness: Tara Caldwell is a 42 y.o. female with history of positive ANA and osteoarthritis.  Patient reports she took 2 doses of Plaquenil and developed a rash on her upper extremities.  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  She discontinued taking Plaquenil after 2 doses.  She has been experiencing nausea but no abdominal pain.  She states that she is having worsening hair loss.  She states she is also been experiencing chills but has not taken her temperature to know if she has a fever.  She says she is having increased myalgias and polyarthralgias.  She states at night she is also been experience increased muscle cramps.  She states that in the morning she noticed swelling in bilateral hands and bilateral wrists which improves as the day goes on.  She states she is having pain in bilateral hands and wrists.  She ranks the pain at 100 out of 10.  She reports that she is also experiencing a rash scattered on the skin with itching.     Activities of Daily Living:  Patient reports morning stiffness   all day.   Patient Denies nocturnal pain.  Difficulty dressing/grooming: Denies Difficulty climbing stairs: Reports Difficulty getting out of chair: Reports Difficulty using hands for taps, buttons, cutlery, and/or writing: Reports  Review of Systems  Constitutional: Positive for fatigue.  HENT: Negative for mouth sores, mouth dryness and nose dryness.   Eyes: Positive for dryness. Negative for pain and visual disturbance.  Respiratory: Negative for cough, hemoptysis, shortness of breath  and difficulty breathing.   Cardiovascular: Negative for chest pain, palpitations, hypertension and swelling in legs/feet.  Gastrointestinal: Positive for constipation. Negative for blood in stool and diarrhea.  Endocrine: Negative for increased urination.  Genitourinary: Negative for painful urination.  Musculoskeletal: Positive for arthralgias, joint pain, joint swelling, myalgias, morning stiffness, muscle tenderness and myalgias. Negative for muscle weakness.  Skin: Positive for rash, hair loss and sensitivity to sunlight. Negative for color change, pallor, nodules/bumps, skin tightness and ulcers.  Allergic/Immunologic: Negative for susceptible to infections.  Neurological: Negative for dizziness, numbness, headaches and weakness.  Hematological: Negative for swollen glands.  Psychiatric/Behavioral: Positive for depressed mood. Negative for sleep disturbance. The patient is nervous/anxious.     PMFS History:  Patient Active Problem List   Diagnosis Date Noted  . Primary osteoarthritis of both knees 07/17/2017  . Primary osteoarthritis of both hands 07/17/2017  . Chronic midline low back pain without sciatica 07/17/2017  . Positive ANA (antinuclear antibody) 07/17/2017  . History of thyroid disorder 04/21/2017  . Viral upper respiratory tract infection 04/21/2017  . Sinus headache 04/21/2017  . General weakness 04/21/2017  . Family history of systemic lupus erythematosus (SLE) in mother 04/21/2017    Past Medical History:  Diagnosis Date  . Asthma     Family History  Problem Relation Age of Onset  . Lupus Mother   . Heart disease Father   . Heart attack Father   . Thyroid disease Sister   .  Heart disease Maternal Grandmother    History reviewed. No pertinent surgical history. Social History   Social History Narrative  . Not on file    Objective: Vital Signs: BP 109/63 (BP Location: Right Arm, Patient Position: Sitting, Cuff Size: Normal)   Pulse 65   Resp 14   Ht  5' 2.6" (1.59 m)   Wt 154 lb (69.9 kg)   BMI 27.63 kg/m    Physical Exam  Constitutional: She is oriented to person, place, and time. She appears well-developed and well-nourished.  HENT:  Head: Normocephalic and atraumatic.  Eyes: Conjunctivae and EOM are normal.  Neck: Normal range of motion.  Cardiovascular: Normal rate, regular rhythm, normal heart sounds and intact distal pulses.  Pulmonary/Chest: Effort normal and breath sounds normal.  Abdominal: Soft. Bowel sounds are normal.  Lymphadenopathy:    She has no cervical adenopathy.  Neurological: She is alert and oriented to person, place, and time.  Skin: Skin is warm and dry. Capillary refill takes less than 2 seconds.  Psychiatric: She has a normal mood and affect. Her behavior is normal.  Nursing note and vitals reviewed.    Musculoskeletal Exam: C-spine, thoracic spine, and lumbar spine good range of motion.  No midline spinal tenderness.  No SI joint tenderness.  Shoulder joints, elbow joints, wrist joints, MCPs, PIPs, DIPs good range of motion with no synovitis.  Hip joints, knee joints, ankle joints, MTPs, PIPs, DIPs good ROM with no synovitis. no warmth or effusion of bilateral knee joints.  No tenderness of trochanteric bursa bilaterally.  CDAI Exam: No CDAI exam completed.   Investigation: No additional findings.  Imaging: No results found.  Recent Labs: Lab Results  Component Value Date   WBC 7.5 04/21/2017   HGB 13.9 04/21/2017   PLT 292 04/21/2017   NA 141 04/21/2017   K 4.3 04/21/2017   CL 103 04/21/2017   CO2 22 04/21/2017   GLUCOSE 87 04/21/2017   BUN 10 04/21/2017   CREATININE 0.58 04/21/2017   BILITOT 0.3 04/21/2017   ALKPHOS 79 04/21/2017   AST 17 04/21/2017   ALT 20 04/21/2017   PROT 7.2 06/25/2017   ALBUMIN 4.3 04/21/2017   CALCIUM 9.1 04/21/2017   GFRAA 131 04/21/2017    Speciality Comments: No specialty comments available.  Procedures:  No procedures performed Allergies: Poison  ivy extract   Assessment / Plan:     Visit Diagnoses: Positive ANA (antinuclear antibody) - ANA 1: 320 homogeneous, C4 low, anticardiolipin IgM 72, fatigue, arthralgias,facial erythema, hair loss.  She most likely have some autoimmune disease: We will check AVISE labs today.  She discontinued Plaquenil after taking 200 mg twice daily for 2 days.  She developed increased photosensitivity and no rash on her upper extremities.  She continues to have myalgias and polyarthralgias.  She continues to have chronic fatigue and chills.  She has not taken her temperature so she is unsure if she is running a fever.  She continues to have hair loss.  She has not had any oral or nasal ulcerations.  No symptoms of Raynaud's.  She continues to have sicca symptoms.  No parotid swelling noted.  No cervical lymphadenopathy.  Patient reports that she has had several tick bites in the past few months.  She would like to rule out Lyme disease and RMSF as a cause for her symptoms.  Labs will be ordered today.   High risk medication use - D/c PLQ 200mg  BID M-F due to rash  Primary osteoarthritis  of both hands:   Primary osteoarthritis of both knees: No warmth or effusion.  She has good range of motion on exam.  She is no discomfort in her knee joints at this time.  Chronic midline low back pain without sciatica: She has no midline spinal tenderness on exam today.  No symptoms of sciatica.  Other medical conditions are listed as follows:  Family history of systemic lupus erythematosus (SLE) in mother  History of thyroid disorder  Other fatigue  History of asthma   Orders: Orders Placed This Encounter  Procedures  . [place] mtn spotted fvr abs pnl(IgG+IgM)  . B. burgdorfi antibodies   No orders of the defined types were placed in this encounter.   Face-to-face time spent with patient was 30  minutes. Greater than 50% of time was spent in counseling and coordination of care.  Follow-Up Instructions: Return in  about 3 months (around 01/22/2018) for Positive ANA, Osteoarthritis.    Hazel Sams PA-C  I examined and evaluated the patient with Hazel Sams PA.  I detailed discussion with the patient.  Patient believes that Plaquenil made her symptoms worse.  She continues to have fatigue and arthralgias.  She is also concerned about possible tick-induced infection.  She states she has had several tick bites.  Per her request we will obtain RMSF and Lyme titers.  I will hold off any additional therapy at this point.  We will obtain AVISE labs today.  The plan of care was discussed as noted above.   Bo Merino, MD  Note - This record has been created using Editor, commissioning.  Chart creation errors have been sought, but may not always  have been located. Such creation errors do not reflect on  the standard of medical care.

## 2017-10-21 DIAGNOSIS — R768 Other specified abnormal immunological findings in serum: Secondary | ICD-10-CM | POA: Diagnosis not present

## 2017-10-22 ENCOUNTER — Encounter: Payer: Self-pay | Admitting: Physician Assistant

## 2017-10-22 ENCOUNTER — Ambulatory Visit: Payer: BLUE CROSS/BLUE SHIELD | Admitting: Physician Assistant

## 2017-10-22 ENCOUNTER — Encounter (INDEPENDENT_AMBULATORY_CARE_PROVIDER_SITE_OTHER): Payer: Self-pay

## 2017-10-22 VITALS — BP 109/63 | HR 65 | Resp 14 | Ht 62.6 in | Wt 154.0 lb

## 2017-10-22 DIAGNOSIS — Z79899 Other long term (current) drug therapy: Secondary | ICD-10-CM | POA: Diagnosis not present

## 2017-10-22 DIAGNOSIS — M19042 Primary osteoarthritis, left hand: Secondary | ICD-10-CM

## 2017-10-22 DIAGNOSIS — M19041 Primary osteoarthritis, right hand: Secondary | ICD-10-CM

## 2017-10-22 DIAGNOSIS — G8929 Other chronic pain: Secondary | ICD-10-CM | POA: Diagnosis not present

## 2017-10-22 DIAGNOSIS — Z8639 Personal history of other endocrine, nutritional and metabolic disease: Secondary | ICD-10-CM | POA: Diagnosis not present

## 2017-10-22 DIAGNOSIS — R768 Other specified abnormal immunological findings in serum: Secondary | ICD-10-CM | POA: Diagnosis not present

## 2017-10-22 DIAGNOSIS — R5383 Other fatigue: Secondary | ICD-10-CM | POA: Diagnosis not present

## 2017-10-22 DIAGNOSIS — M545 Low back pain, unspecified: Secondary | ICD-10-CM

## 2017-10-22 DIAGNOSIS — M170 Bilateral primary osteoarthritis of knee: Secondary | ICD-10-CM

## 2017-10-22 DIAGNOSIS — Z8269 Family history of other diseases of the musculoskeletal system and connective tissue: Secondary | ICD-10-CM | POA: Diagnosis not present

## 2017-10-22 DIAGNOSIS — Z8709 Personal history of other diseases of the respiratory system: Secondary | ICD-10-CM

## 2017-10-23 LAB — ROCKY MTN SPOTTED FVR ABS PNL(IGG+IGM)
RMSF IGG: DETECTED — AB
RMSF IGM: NOT DETECTED

## 2017-10-23 LAB — B. BURGDORFI ANTIBODIES: B burgdorferi Ab IgG+IgM: <0.9 index

## 2017-10-23 LAB — REFLEX RMSF IGG TITER: RMSF IgG Titer: 1:64 {titer} — ABNORMAL HIGH

## 2017-10-26 NOTE — Progress Notes (Signed)
Office Visit Note  Patient: Tara Caldwell             Date of Birth: [birth year]           MRN: [id number]             PCP: Horald Pollen, MD Referring: Horald Pollen, * Visit Date: 10/27/2017 Occupation: @GUAROCC @  Interpreter: Deetta Perla  Subjective:  Pain in multiple joints and muscles.   History of Present Illness: Tara Caldwell is a 42 y.o. female with history of positive ANA.  She was accompanied by her husband and the interpreter today.  She states she continues to have pain all over.  She also gives history of photosensitivity, facial rash and hair loss.  I do not see any rash on examination today.  She gives history of joint swelling.  No swelling was noted on examination.  Activities of Daily Living:  Patient reports morning stiffness for 30 minutes.   Patient Reports nocturnal pain.  Difficulty dressing/grooming: Denies Difficulty climbing stairs: Denies Difficulty getting out of chair: Denies Difficulty using hands for taps, buttons, cutlery, and/or writing: Reports  Review of Systems  Constitutional: Positive for fatigue. Negative for night sweats, weight gain and weight loss.  HENT: Positive for mouth sores. Negative for trouble swallowing, trouble swallowing, mouth dryness and nose dryness.   Eyes: Negative for pain, redness, visual disturbance and dryness.  Respiratory: Negative for cough, shortness of breath and difficulty breathing.   Cardiovascular: Negative for chest pain, palpitations, hypertension, irregular heartbeat and swelling in legs/feet.  Gastrointestinal: Negative for blood in stool, constipation and diarrhea.  Endocrine: Negative for increased urination.  Genitourinary: Negative for vaginal dryness.  Musculoskeletal: Positive for arthralgias, joint pain, joint swelling, myalgias, morning stiffness and myalgias. Negative for muscle weakness and muscle tenderness.  Skin: Positive for rash, hair  loss and sensitivity to sunlight. Negative for color change, skin tightness and ulcers.  Allergic/Immunologic: Negative for susceptible to infections.  Neurological: Negative for dizziness, memory loss, night sweats and weakness.  Hematological: Negative for swollen glands.  Psychiatric/Behavioral: Positive for sleep disturbance. Negative for depressed mood. The patient is not nervous/anxious.     PMFS History:  Patient Active Problem List   Diagnosis Date Noted  . Primary osteoarthritis of both knees 07/17/2017  . Primary osteoarthritis of both hands 07/17/2017  . Chronic midline low back pain without sciatica 07/17/2017  . Positive ANA (antinuclear antibody) 07/17/2017  . History of thyroid disorder 04/21/2017  . Viral upper respiratory tract infection 04/21/2017  . Sinus headache 04/21/2017  . General weakness 04/21/2017  . Family history of systemic lupus erythematosus (SLE) in mother 04/21/2017    Past Medical History:  Diagnosis Date  . Asthma     Family History  Problem Relation Age of Onset  . Lupus Mother   . Heart disease Father   . Heart attack Father   . Thyroid disease Sister   . Heart disease Maternal Grandmother    History reviewed. No pertinent surgical history. Social History   Social History Narrative  . Not on file    Objective: Vital Signs: BP 108/64 (BP Location: Left Arm, Patient Position: Sitting, Cuff Size: Normal)   Pulse 61   Resp 15   Ht 5' 2.6" (1.59 m)   Wt 157 lb (71.2 kg)   BMI 28.17 kg/m    Physical Exam  Constitutional: She is oriented to person, place, and time. She appears well-developed and well-nourished.  HENT:  Head: Normocephalic and atraumatic.  No hair thinning noted.  Eyes: Conjunctivae and EOM are normal.  Neck: Normal range of motion.  Cardiovascular: Normal rate, regular rhythm, normal heart sounds and intact distal pulses.  Pulmonary/Chest: Effort normal and breath sounds normal.  Abdominal: Soft. Bowel sounds are  normal.  Lymphadenopathy:    She has no cervical adenopathy.  Neurological: She is alert and oriented to person, place, and time.  Skin: Skin is warm and dry. Capillary refill takes less than 2 seconds.  No malar rash or photosensitive rash noted.  Psychiatric: She has a normal mood and affect. Her behavior is normal.  Nursing note and vitals reviewed.    Musculoskeletal Exam: C-spine thoracic lumbar spine good range of motion.  Shoulder joints elbow joints wrist joint MCPs PIPs DIPs were in good range of motion with no synovitis.  Hip joints knee joints ankles MTPs PIPs been good range of motion.  She had positive tender points and some generalized hyperalgesia.  CDAI Exam: No CDAI exam completed.   Investigation: Findings:  October 21, 2017 AVISE to 1+ positive, CB CAP strong positive, ANA positive, dsDNA negative, all other ENA negative anticardiolipin negative   Imaging: No results found.  Recent Labs: Lab Results  Component Value Date   WBC 7.5 04/21/2017   HGB 13.9 04/21/2017   PLT 292 04/21/2017   NA 141 04/21/2017   K 4.3 04/21/2017   CL 103 04/21/2017   CO2 22 04/21/2017   GLUCOSE 87 04/21/2017   BUN 10 04/21/2017   CREATININE 0.58 04/21/2017   BILITOT 0.3 04/21/2017   ALKPHOS 79 04/21/2017   AST 17 04/21/2017   ALT 20 04/21/2017   PROT 7.2 06/25/2017   ALBUMIN 4.3 04/21/2017   CALCIUM 9.1 04/21/2017   GFRAA 131 04/21/2017    Speciality Comments: No specialty comments available.  Procedures:  No procedures performed Allergies: Poison ivy extract   Assessment / Plan:     Visit Diagnoses: Autoimmune disease ([place]) -  Positive ANA, positive anticardiolipin IgM (repeat anticardiolipin was negative), hypocomplementemia, fatigue, arthralgias, facial erythema, hair loss.  I do not see any synovitis on examination.  I do not see facial rash or photosensitive rash.  Patient continues to have generalized pain and discomfort.  We gave her a trial of Plaquenil but she  had side effects within 2 days which included brittle nails and dizziness and headaches.  I am hesitant to put her on any other immunosuppressive agents.  I would like to observe over time if she develops any additional symptoms.  Patient is quite concerned about lupus and would like to have a second opinion regarding her diagnosis.  There is positive family history of lupus in her mother.  I will make a referral to a tertiary care center.  Primary osteoarthritis of both hands-she had mild radiographic changes and has ongoing discomfort.  Primary osteoarthritis of both knees-she has discomfort in bilateral knee joints.  History of thyroid disorder  Chronic midline low back pain without sciatica  Family history of systemic lupus erythematosus (SLE) in mother  Myofascial pain -we had detailed discussion regarding the myofascial pain.  She has generalized pain and hyperalgesia which could be contributing to most of her symptoms.  Orders: Orders Placed This Encounter  Procedures  . Ambulatory referral to Rheumatology   No orders of the defined types were placed in this encounter.   Face-to-face time spent with patient was 30 minutes. Greater than 50% of time was spent in counseling and  coordination of care.  Follow-Up Instructions: Return in about 5 months (around 03/29/2018).   Bo Merino, MD  Note - This record has been created using Editor, commissioning.  Chart creation errors have been sought, but may not always  have been located. Such creation errors do not reflect on  the standard of medical care.

## 2017-10-26 NOTE — Progress Notes (Signed)
Past exposure to RMSF. No current infection.

## 2017-10-27 ENCOUNTER — Encounter: Payer: Self-pay | Admitting: Physician Assistant

## 2017-10-27 ENCOUNTER — Ambulatory Visit: Payer: BLUE CROSS/BLUE SHIELD | Admitting: Rheumatology

## 2017-10-27 VITALS — BP 108/64 | HR 61 | Resp 15 | Ht 62.6 in | Wt 157.0 lb

## 2017-10-27 DIAGNOSIS — M19041 Primary osteoarthritis, right hand: Secondary | ICD-10-CM | POA: Diagnosis not present

## 2017-10-27 DIAGNOSIS — Z8269 Family history of other diseases of the musculoskeletal system and connective tissue: Secondary | ICD-10-CM

## 2017-10-27 DIAGNOSIS — D8989 Other specified disorders involving the immune mechanism, not elsewhere classified: Secondary | ICD-10-CM

## 2017-10-27 DIAGNOSIS — M170 Bilateral primary osteoarthritis of knee: Secondary | ICD-10-CM | POA: Diagnosis not present

## 2017-10-27 DIAGNOSIS — M545 Low back pain: Secondary | ICD-10-CM

## 2017-10-27 DIAGNOSIS — M359 Systemic involvement of connective tissue, unspecified: Secondary | ICD-10-CM

## 2017-10-27 DIAGNOSIS — Z8639 Personal history of other endocrine, nutritional and metabolic disease: Secondary | ICD-10-CM

## 2017-10-27 DIAGNOSIS — M19042 Primary osteoarthritis, left hand: Secondary | ICD-10-CM

## 2017-10-27 DIAGNOSIS — G8929 Other chronic pain: Secondary | ICD-10-CM

## 2017-10-27 DIAGNOSIS — M7918 Myalgia, other site: Secondary | ICD-10-CM

## 2017-11-02 ENCOUNTER — Ambulatory Visit: Payer: BLUE CROSS/BLUE SHIELD | Admitting: Rheumatology

## 2018-01-22 ENCOUNTER — Ambulatory Visit: Payer: BLUE CROSS/BLUE SHIELD | Admitting: Physician Assistant

## 2018-02-02 DIAGNOSIS — M359 Systemic involvement of connective tissue, unspecified: Secondary | ICD-10-CM | POA: Diagnosis not present

## 2018-02-02 DIAGNOSIS — M2550 Pain in unspecified joint: Secondary | ICD-10-CM | POA: Diagnosis not present

## 2018-02-02 DIAGNOSIS — R768 Other specified abnormal immunological findings in serum: Secondary | ICD-10-CM | POA: Diagnosis not present

## 2018-02-02 DIAGNOSIS — R899 Unspecified abnormal finding in specimens from other organs, systems and tissues: Secondary | ICD-10-CM | POA: Diagnosis not present

## 2018-02-10 ENCOUNTER — Encounter: Payer: BLUE CROSS/BLUE SHIELD | Admitting: Emergency Medicine

## 2018-02-25 ENCOUNTER — Ambulatory Visit (INDEPENDENT_AMBULATORY_CARE_PROVIDER_SITE_OTHER): Payer: BLUE CROSS/BLUE SHIELD | Admitting: Emergency Medicine

## 2018-02-25 ENCOUNTER — Encounter: Payer: Self-pay | Admitting: Emergency Medicine

## 2018-02-25 ENCOUNTER — Other Ambulatory Visit: Payer: Self-pay

## 2018-02-25 VITALS — BP 99/61 | HR 58 | Temp 98.8°F | Resp 16 | Ht 61.0 in | Wt 151.2 lb

## 2018-02-25 DIAGNOSIS — Z130 Encounter for screening for diseases of the blood and blood-forming organs and certain disorders involving the immune mechanism: Secondary | ICD-10-CM

## 2018-02-25 DIAGNOSIS — Z13220 Encounter for screening for lipoid disorders: Secondary | ICD-10-CM | POA: Diagnosis not present

## 2018-02-25 DIAGNOSIS — Z13228 Encounter for screening for other metabolic disorders: Secondary | ICD-10-CM

## 2018-02-25 DIAGNOSIS — Z1329 Encounter for screening for other suspected endocrine disorder: Secondary | ICD-10-CM

## 2018-02-25 DIAGNOSIS — Z0000 Encounter for general adult medical examination without abnormal findings: Secondary | ICD-10-CM

## 2018-02-25 NOTE — Patient Instructions (Addendum)
Health Maintenance, Female Adopting a healthy lifestyle and getting preventive care can go a long way to promote health and wellness. Talk with your health care provider about what schedule of regular examinations is right for you. This is a good chance for you to check in with your provider about disease prevention and staying healthy. In between checkups, there are plenty of things you can do on your own. Experts have done a lot of research about which lifestyle changes and preventive measures are most likely to keep you healthy. Ask your health care provider for more information. Weight and diet Eat a healthy diet  Be sure to include plenty of vegetables, fruits, low-fat dairy products, and lean protein.  Do not eat a lot of foods high in solid fats, added sugars, or salt.  Get regular exercise. This is one of the most important things you can do for your health. ? Most adults should exercise for at least 150 minutes each week. The exercise should increase your heart rate and make you sweat (moderate-intensity exercise). ? Most adults should also do strengthening exercises at least twice a week. This is in addition to the moderate-intensity exercise.  Maintain a healthy weight  Body mass index (BMI) is a measurement that can be used to identify possible weight problems. It estimates body fat based on height and weight. Your health care provider can help determine your BMI and help you achieve or maintain a healthy weight.  For females 44 years of age and older: ? A BMI below 18.5 is considered underweight. ? A BMI of 18.5 to 24.9 is normal. ? A BMI of 25 to 29.9 is considered overweight. ? A BMI of 30 and above is considered obese.  Watch levels of cholesterol and blood lipids  You should start having your blood tested for lipids and cholesterol at 44 years of age, then have this test every 5 years.  You may need to have your cholesterol levels checked more often if: ? Your lipid or  cholesterol levels are high. ? You are older than 44 years of age. ? You are at high risk for heart disease.  Cancer screening Lung Cancer  Lung cancer screening is recommended for adults 44-27 years old who are at high risk for lung cancer because of a history of smoking.  A yearly low-dose CT scan of the lungs is recommended for people who: ? Currently smoke. ? Have quit within the past 15 years. ? Have at least a 30-pack-year history of smoking. A pack year is smoking an average of one pack of cigarettes a day for 1 year.  Yearly screening should continue until it has been 15 years since you quit.  Yearly screening should stop if you develop a health problem that would prevent you from having lung cancer treatment.  Breast Cancer  Practice breast self-awareness. This means understanding how your breasts normally appear and feel.  It also means doing regular breast self-exams. Let your health care provider know about any changes, no matter how small.  If you are in your 20s or 30s, you should have a clinical breast exam (CBE) by a health care provider every 1-3 years as part of a regular health exam.  If you are 44 or older, have a CBE every year. Also consider having a breast X-ray (mammogram) every year.  If you have a family history of breast cancer, talk to your health care provider about genetic screening.  If you are at high risk  for breast cancer, talk to your health care provider about having an MRI and a mammogram every year.  Breast cancer gene (BRCA) assessment is recommended for women who have family members with BRCA-related cancers. BRCA-related cancers include: ? Breast. ? Ovarian. ? Tubal. ? Peritoneal cancers.  Results of the assessment will determine the need for genetic counseling and BRCA1 and BRCA2 testing.  Cervical Cancer Your health care provider may recommend that you be screened regularly for cancer of the pelvic organs (ovaries, uterus, and  vagina). This screening involves a pelvic examination, including checking for microscopic changes to the surface of your cervix (Pap test). You may be encouraged to have this screening done every 3 years, beginning at age 44.  For women ages 44-65, health care providers may recommend pelvic exams and Pap testing every 3 years, or they may recommend the Pap and pelvic exam, combined with testing for human papilloma virus (HPV), every 5 years. Some types of HPV increase your risk of cervical cancer. Testing for HPV may also be done on women of any age with unclear Pap test results.  Other health care providers may not recommend any screening for nonpregnant women who are considered low risk for pelvic cancer and who do not have symptoms. Ask your health care provider if a screening pelvic exam is right for you.  If you have had past treatment for cervical cancer or a condition that could lead to cancer, you need Pap tests and screening for cancer for at least 20 years after your treatment. If Pap tests have been discontinued, your risk factors (such as having a new sexual partner) need to be reassessed to determine if screening should resume. Some women have medical problems that increase the chance of getting cervical cancer. In these cases, your health care provider may recommend more frequent screening and Pap tests.  Colorectal Cancer  This type of cancer can be detected and often prevented.  Routine colorectal cancer screening usually begins at 44 years of age and continues through 44 years of age.  Your health care provider may recommend screening at an earlier age if you have risk factors for colon cancer.  Your health care provider may also recommend using home test kits to check for hidden blood in the stool.  A small camera at the end of a tube can be used to examine your colon directly (sigmoidoscopy or colonoscopy). This is done to check for the earliest forms of colorectal  cancer.  Routine screening usually begins at age 44.  Direct examination of the colon should be repeated every 5-10 years through 44 years of age. However, you may need to be screened more often if early forms of precancerous polyps or small growths are found.  Skin Cancer  Check your skin from head to toe regularly.  Tell your health care provider about any new moles or changes in moles, especially if there is a change in a mole's shape or color.  Also tell your health care provider if you have a mole that is larger than the size of a pencil eraser.  Always use sunscreen. Apply sunscreen liberally and repeatedly throughout the day.  Protect yourself by wearing long sleeves, pants, a wide-brimmed hat, and sunglasses whenever you are outside.  Heart disease, diabetes, and high blood pressure  High blood pressure causes heart disease and increases the risk of stroke. High blood pressure is more likely to develop in: ? People who have blood pressure in the high end of  the normal range (130-139/85-89 mm Hg). ? People who are overweight or obese. ? People who are African American.  If you are 21-29 years of age, have your blood pressure checked every 3-5 years. If you are 3 years of age or older, have your blood pressure checked every year. You should have your blood pressure measured twice-once when you are at a hospital or clinic, and once when you are not at a hospital or clinic. Record the average of the two measurements. To check your blood pressure when you are not at a hospital or clinic, you can use: ? An automated blood pressure machine at a pharmacy. ? A home blood pressure monitor.  If you are between 17 years and 37 years old, ask your health care provider if you should take aspirin to prevent strokes.  Have regular diabetes screenings. This involves taking a blood sample to check your fasting blood sugar level. ? If you are at a normal weight and have a low risk for diabetes,  have this test once every three years after 44 years of age. ? If you are overweight and have a high risk for diabetes, consider being tested at a younger age or more often. Preventing infection Hepatitis B  If you have a higher risk for hepatitis B, you should be screened for this virus. You are considered at high risk for hepatitis B if: ? You were born in a country where hepatitis B is common. Ask your health care provider which countries are considered high risk. ? Your parents were born in a high-risk country, and you have not been immunized against hepatitis B (hepatitis B vaccine). ? You have HIV or AIDS. ? You use needles to inject street drugs. ? You live with someone who has hepatitis B. ? You have had sex with someone who has hepatitis B. ? You get hemodialysis treatment. ? You take certain medicines for conditions, including cancer, organ transplantation, and autoimmune conditions.  Hepatitis C  Blood testing is recommended for: ? Everyone born from [birth year] through 1965. ? Anyone with known risk factors for hepatitis C.  Sexually transmitted infections (STIs)  You should be screened for sexually transmitted infections (STIs) including gonorrhea and chlamydia if: ? You are sexually active and are younger than 44 years of age. ? You are older than 44 years of age and your health care provider tells you that you are at risk for this type of infection. ? Your sexual activity has changed since you were last screened and you are at an increased risk for chlamydia or gonorrhea. Ask your health care provider if you are at risk.  If you do not have HIV, but are at risk, it may be recommended that you take a prescription medicine daily to prevent HIV infection. This is called pre-exposure prophylaxis (PrEP). You are considered at risk if: ? You are sexually active and do not regularly use condoms or know the HIV status of your partner(s). ? You take drugs by injection. ? You are  sexually active with a partner who has HIV.  Talk with your health care provider about whether you are at high risk of being infected with HIV. If you choose to begin PrEP, you should first be tested for HIV. You should then be tested every 3 months for as long as you are taking PrEP. Pregnancy  If you are premenopausal and you may become pregnant, ask your health care provider about preconception counseling.  If you may become  pregnant, take 400 to 800 micrograms (mcg) of folic acid every day.  If you want to prevent pregnancy, talk to your health care provider about birth control (contraception). Osteoporosis and menopause  Osteoporosis is a disease in which the bones lose minerals and strength with aging. This can result in serious bone fractures. Your risk for osteoporosis can be identified using a bone density scan.  If you are 68 years of age or older, or if you are at risk for osteoporosis and fractures, ask your health care provider if you should be screened.  Ask your health care provider whether you should take a calcium or vitamin D supplement to lower your risk for osteoporosis.  Menopause may have certain physical symptoms and risks.  Hormone replacement therapy may reduce some of these symptoms and risks. Talk to your health care provider about whether hormone replacement therapy is right for you. Follow these instructions at home:  Schedule regular health, dental, and eye exams.  Stay current with your immunizations.  Do not use any tobacco products including cigarettes, chewing tobacco, or electronic cigarettes.  If you are pregnant, do not drink alcohol.  If you are breastfeeding, limit how much and how often you drink alcohol.  Limit alcohol intake to no more than 1 drink per day for nonpregnant women. One drink equals 12 ounces of beer, 5 ounces of wine, or 1 ounces of hard liquor.  Do not use street drugs.  Do not share needles.  Ask your health care  provider for help if you need support or information about quitting drugs.  Tell your health care provider if you often feel depressed.  Tell your health care provider if you have ever been abused or do not feel safe at home. This information is not intended to replace advice given to you by your health care provider. Make sure you discuss any questions you have with your health care provider. Document Released: 10/07/2010 Document Revised: 08/30/2015 Document Reviewed: 12/26/2014 Elsevier Interactive Patient Education  2018 [place] (Health Maintenance, Female) Un estilo de vida saludable y los cuidados preventivos pueden favorecer considerablemente a la salud y Musician. Pregunte a su mdico cul es el cronograma de exmenes peridicos apropiado para usted. Esta es una buena oportunidad para consultarlo sobre cmo prevenir enfermedades y [place] sano. Adems de los controles, hay muchas otras cosas que puede hacer usted mismo. Los expertos han realizado numerosas investigaciones [employer] cambios en el estilo de vida y las medidas de prevencin que, [place], lo ayudarn a mantenerse sano. Solicite a su mdico ms informacin. EL PESO Y LA DIETA Consuma una dieta saludable.  Asegrese de [employer] verduras, frutas, productos lcteos de bajo contenido de [place] y Advertising account planner.  No consuma muchos alimentos de alto contenido de grasas slidas, azcares agregados o sal.  Realice actividad fsica con regularidad. Esta es una de las prcticas ms importantes que puede hacer por su salud. ? La Delorise Shiner de los adultos deben hacer ejercicio durante al menos 143mnutos por semana. El ejercicio debe aumentar la frecuencia cardaca y pActorla transpiracin (ejercicio de [place]. ? La mayora de los adultos tambin deben hacer ejercicios de elongacin al mToysRusveces a la semana. Agregue esto al su plan de ejercicio de  intensidad moderada. Mantenga un peso saludable.  El ndice de masa corporal ([facility] es una medida que puede utilizarse para identificar posibles problemas de pH[place]ilton Proporciona una estimacin de la gAir traffic controllerbasndose  en el peso y la altura. Su mdico puede ayudarle a Radiation protection practitioner [place] y a Scientist, forensic o Theatre manager un peso saludable.  Para las mujeres de 20aos o ms: ? Un [facility] menor de 18,5 se considera bajo peso. ? Un [facility] entre 18,5 y 24,9 es normal. ? Un [facility] entre 25 y 29,9 se considera sobrepeso. ? Un IMC de 30 o ms se considera obesidad. Observe los niveles de colesterol y lpidos en la sangre.  Debe comenzar a English as a second language teacher de lpidos y Research officer, trade union en la sangre a los 20aos y luego repetirlos cada 38aos.  Es posible que Automotive engineer los niveles de colesterol con mayor frecuencia si: ? Sus niveles de lpidos y colesterol son altos. ? Es mayor de 02IOX. ? Presenta un alto riesgo de padecer enfermedades cardacas. DETECCIN DE CNCER Cncer de pulmn  Se recomienda realizar exmenes de deteccin de cncer de pulmn a personas adultas entre 2 y 7 aos que estn en riesgo de Horticulturist, commercial de pulmn por sus antecedentes de consumo de tabaco.  Se recomienda una tomografa computarizada de baja dosis de los pulmones todos los aos a las personas que: ? Fuman actualmente. ? Hayan dejado el hbito en algn momento en los ltimos 15aos. ? Hayan fumado durante 30aos un paquete diario. Un paquete-ao equivale a fumar un promedio de un paquete de cigarrillos diario durante un ao.  Los exmenes de deteccin anuales deben continuar hasta que hayan pasado 15aos desde que dej de fumar.  Ya no debern realizarse si tiene un problema de salud que le impida recibir tratamiento para Science writer de pulmn. Cncer de mama  Practique la autoconciencia de la mama. Esto significa reconocer la apariencia normal de sus mamas y cmo las siente.  Tambin significa realizar  autoexmenes regulares de [employer]. Informe a su mdico sobre cualquier cambio, sin importar cun pequeo sea.  Si tiene entre 20 y 98 aos, un mdico debe realizarle un examen clnico de las mamas como parte del examen regular de [place], cada 1 a 3aos.  Si tiene 40aos o ms, debe Information systems manager clnico de las [employer]. Tambin considere realizarse una [place] todos los [place].  Si tiene antecedentes familiares de cncer de mama, hable con su mdico para someterse a un estudio gentico.  Si tiene alto riesgo de Chief Financial Officer de mama, hable con su mdico para someterse a Public house manager y [employer].  La evaluacin del gen del cncer de mama (BRCA) se recomienda a mujeres que tengan familiares con cnceres relacionados con el BRCA. Los cnceres relacionados con el BRCA incluyen los siguientes: ? [place]. ? Ovario. ? Trompas. ? Cnceres de peritoneo.  Los resultados de la evaluacin determinarn la necesidad de asesoramiento gentico y de [place] de BRCA1 y BRCA2. Cncer de cuello del tero El mdico puede recomendarle que se haga pruebas peridicas de deteccin de cncer de los rganos de la pelvis (ovarios, tero y vagina). Estas pruebas incluyen un examen plvico, que abarca controlar si se produjeron cambios microscpicos en la superficie del cuello del tero (prueba de Papanicolaou). Pueden recomendarle que se haga estas pruebas cada 3aos, a partir de los 21aos.  A las mujeres que tienen entre 30 y 38aos, los mdicos pueden recomendarles que se sometan a exmenes plvicos y pruebas de Papanicolaou cada 59aos, o a la prueba de Papanicolaou y el examen plvico en combinacin con estudios de deteccin del virus del papiloma humano (VPH) cada 5aos.  Algunos tipos de VPH aumentan el riesgo de Chief Financial Officer de cuello del tero. La prueba para la deteccin del VPH tambin puede realizarse a mujeres de cualquier edad  cuyos resultados de la prueba de Papanicolaou no sean claros.  Es posible que otros mdicos no recomienden exmenes de deteccin a mujeres no embarazadas que se consideran sujetos de bajo riesgo de Chief Financial Officer de pelvis y que no tienen sntomas. Pregntele al mdico si un examen plvico de deteccin es adecuado para usted.  Si ha recibido un tratamiento para Science writer cervical o una enfermedad que podra causar cncer, necesitar realizarse una prueba de Papanicolaou y controles durante al menos 80 aos de concluido el [place]. Si no se ha hecho el Papanicolaou con regularidad, debern volver a evaluarse los factores de riesgo (como tener un nuevo compaero sexual), para Teacher, adult education si debe realizarse los estudios nuevamente. Algunas mujeres sufren problemas mdicos que aumentan la probabilidad de Museum/gallery curator cncer de cuello del tero. En estos casos, el mdico podr [employer] se realicen controles y pruebas de Papanicolaou con ms frecuencia. Cncer colorrectal  Este tipo de cncer puede detectarse y a menudo prevenirse.  Por lo general, los estudios de rutina se deben Medical laboratory scientific officer a Field seismologist a Proofreader de los 26 aos y [place] 86 aos.  Sin embargo, el mdico podr aconsejarle que lo haga antes, si tiene factores de riesgo para el cncer de colon.  Tambin puede recomendarle que use un kit de prueba para Hydrologist en la materia fecal.  Es posible que se use una pequea cmara en el extremo de un tubo para examinar directamente el colon (sigmoidoscopia o colonoscopia) a fin de Hydrographic surveyor formas tempranas de cncer colorrectal.  Los exmenes de rutina generalmente comienzan a los 14aos.  El examen directo del colon se debe repetir cada 5 a 10aos hasta los 75aos. Sin embargo, es posible que se realicen exmenes con mayor frecuencia, si se detectan formas tempranas de plipos precancerosos o pequeos bultos. Cncer de piel  Revise la piel de la cabeza a los pies con  regularidad.  Informe a su mdico si aparecen nuevos lunares o los que tiene se modifican, especialmente en su forma y color.  Tambin notifique al mdico si tiene un lunar que es ms grande que el tamao de una goma de lpiz.  Siempre use pantalla solar. Aplique pantalla solar de Kerry Dory y repetida a lo largo del Training and development officer.  Protjase usando mangas y [employer], un sombrero de ala ancha y gafas para el sol, siempre que se encuentre en el exterior. ENFERMEDADES CARDACAS, DIABETES E HIPERTENSIN ARTERIAL  La hipertensin arterial causa enfermedades cardacas y [place] el riesgo de ictus. La hipertensin arterial es ms probable en los siguientes casos: ? Las personas que tienen la presin arterial en el extremo del rango normal (100-139/85-89 mm Hg). ? [employer] con sobrepeso u obesidad. ? Scientist, water quality.  Si usted tiene entre 18 y 39 aos, debe medirse la presin arterial cada 3 a 5 aos. Si usted tiene 40 aos o ms, debe medirse la presin arterial [employer]. Debe medirse la presin arterial dos veces: una vez cuando est en un hospital o una clnica y la otra vez cuando est en otro sitio. Registre el promedio de [employer]. Para controlar su presin arterial cuando no est en un hospital o [place], puede usar lo siguiente: ? Jorje Guild automtica para medir la presin arterial en una farmacia. ? Un monitor para medir la  presin arterial en el hogar.  Si tiene entre 51 y 40 aos, consulte a su mdico si debe tomar aspirina para prevenir el ictus.  Realcese exmenes de deteccin de la diabetes con regularidad. Esto incluye la toma de [place] de sangre para controlar el nivel de azcar en la sangre durante el [place]. ? Si tiene un peso normal y un bajo riesgo de padecer diabetes, realcese este anlisis cada tres aos despus de los 45aos. ? Si tiene sobrepeso y un alto riesgo de padecer diabetes, considere someterse a este anlisis antes o con  mayor frecuencia. PREVENCIN DE INFECCIONES HepatitisB  Si tiene un riesgo ms alto de Museum/gallery curator hepatitis B, debe someterse a un examen de deteccin de este virus. Se considera que tiene un alto riesgo de contraer hepatitis B si: ? Naci en un pas donde la hepatitis B es frecuente. Pregntele a su mdico qu pases son considerados de Public affairs consultant. ? Sus padres nacieron en un pas de alto riesgo y usted no recibi una vacuna que lo proteja contra la hepatitis B (vacuna contra la hepatitis B). ? [place]. ? [place] agujas para inyectarse drogas. ? Vive con alguien que tiene hepatitis B. ? Ha tenido sexo con alguien que tiene hepatitis B. ? Recibe tratamiento de hemodilisis. ? Toma ciertos medicamentos para el cncer, trasplante de rganos y afecciones autoinmunitarias. Hepatitis C  Se recomienda un anlisis de [place] para: ? [employer] 1945 y 1965. ? Todas las personas que tengan un riesgo de haber contrado hepatitis C. Enfermedades de transmisin sexual (ETS).  Debe realizarse pruebas de deteccin de enfermedades de transmisin sexual (ETS), incluidas gonorrea y clamidia si: ? Es sexualmente activo y es menor de 10XNA. ? Es mayor de 24aos, y Investment banker, operational informa que corre riesgo de tener este tipo de infecciones. ? La actividad sexual ha cambiado desde que le hicieron la ltima prueba de deteccin y tiene un riesgo mayor de Best boy clamidia o Radio broadcast assistant. Pregntele al mdico si usted tiene riesgo.  Si no tiene el VIH, pero corre riesgo de infectarse por el virus, se recomienda tomar diariamente un medicamento recetado para evitar la infeccin. Esto se conoce como profilaxis previa a la exposicin. Se considera que est en riesgo si: ? Es [place] sexualmente y no [place] preservativos habitualmente o no conoce el estado del VIH de sus Advertising copywriter. ? Se inyecta drogas. ? Es [place] sexualmente con Ardelia Mems pareja que tiene VIH. Consulte a su mdico para saber si tiene un  alto riesgo de infectarse por el VIH. Si opta por comenzar la profilaxis previa a la exposicin, primero debe realizarse anlisis de deteccin del VIH. Luego, le harn anlisis cada 44mses mientras est tomando los medicamentos para la profilaxis previa a la exposicin. [facility] Si es premenopusica y puede quedar eChamita solicite a su mdico asesoramiento previo a la concepcin.  Si puede quedar embarazada, tome 400 a [id number]([id number]) de cido fAnheuser-Busch  Si desea evitar el embarazo, hable con su mdico sobre el control de la natalidad (anticoncepcin). OSTEOPOROSIS Y MENOPAUSIA  La osteoporosis es una enfermedad en la que los huesos pierden los minerales y la fuerza por el avance de la edad. El resultado pueden ser fracturas graves en los hClifton El riesgo de osteoporosis puede identificarse con uArdelia Memsprueba de densidad sea.  Si tiene 63aos o ms, o si est en riesgo de sufrir osteoporosis y fracturas, pregunte a su mdico si debe someterse a exmenes.  Consulte a su mdico si debe tomar un suplemento de calcio o de vitamina D para reducir el riesgo de osteoporosis.  La menopausia puede presentar ciertos sntomas fsicos y Gaffer.  La terapia de reemplazo hormonal puede reducir algunos de estos sntomas y Gaffer. Consulte a su mdico para saber si la terapia de reemplazo hormonal es conveniente para usted. INSTRUCCIONES PARA EL CUIDADO EN EL HOGAR  Realcese los estudios de rutina de la salud, dentales y de Public librarian.  [place].  No consuma ningn producto que contenga tabaco, lo que incluye cigarrillos, tabaco de Higher education careers adviser o Psychologist, sport and exercise.  Si est embarazada, no beba alcohol.  Si est amamantando, reduzca el consumo de alcohol y la frecuencia con la que consume.  Si es mujer y no est embarazada limite el consumo de alcohol a no ms de 1 medida por da. Una medida equivale a 12onzas de cerveza, 5onzas de vino o 1onzas  de bebidas alcohlicas de alta graduacin.  No consuma drogas.  No comparta agujas.  Solicite ayuda a su mdico si necesita apoyo o informacin para abandonar las drogas.  Informe a su mdico si a menudo se siente deprimido.  Notifique a su mdico si alguna vez ha sido vctima de abuso o si no se siente seguro en su hogar. Esta informacin no tiene Marine scientist el consejo del mdico. Asegrese de hacerle al mdico cualquier pregunta que tenga. Document Released: 03/13/2011 Document Revised: 04/14/2014 Document Reviewed: 12/26/2014 [employer] Interactive Patient Education  [employer].

## 2018-02-25 NOTE — Progress Notes (Signed)
Lynnea Maizes 42 y.o.   Chief Complaint  Patient presents with  . Annual Exam    HISTORY OF PRESENT ILLNESS: This is a 42 y.o. female here for her annual exam.  Seen by me on 04/21/2017.  Blood work showed positive ANA test.  Has seen a rheumatologist and diagnosed with lupus.  Started on Plaquenil 200 mg twice a day.  Has no complaints or medical concerns today.  HPI   Prior to Admission medications   Medication Sig Start Date End Date Taking? Authorizing Provider  fluticasone (FLONASE) 50 MCG/ACT nasal spray Place into both nostrils daily.   Yes [provider]  levonorgestrel (MIRENA) 20 MCG/24HR IUD 1 each by Intrauterine route once.    [provider]    Allergies  Allergen Reactions  . Poison Ivy Extract Hives    Patient Active Problem List   Diagnosis Date Noted  . Primary osteoarthritis of both knees 07/17/2017  . Primary osteoarthritis of both hands 07/17/2017  . Chronic midline low back pain without sciatica 07/17/2017  . Positive ANA (antinuclear antibody) 07/17/2017  . History of thyroid disorder 04/21/2017  . Viral upper respiratory tract infection 04/21/2017  . Sinus headache 04/21/2017  . General weakness 04/21/2017  . Family history of systemic lupus erythematosus (SLE) in mother 04/21/2017    Past Medical History:  Diagnosis Date  . Asthma     No past surgical history on file.  Social History   Socioeconomic History  . Marital status: Married    Spouse name: Not on file  . Number of children: Not on file  . Years of education: Not on file  . Highest education level: Not on file  Occupational History  . Not on file  Social Needs  . Financial resource strain: Not on file  . Food insecurity:    Worry: Not on file    Inability: Not on file  . Transportation needs:    Medical: Not on file    Non-medical: Not on file  Tobacco Use  . Smoking status: Never Smoker  . Smokeless tobacco: Never Used  Substance  and Sexual Activity  . Alcohol use: No  . Drug use: No  . Sexual activity: Yes    Birth control/protection: None  Lifestyle  . Physical activity:    Days per week: Not on file    Minutes per session: Not on file  . Stress: Not on file  Relationships  . Social connections:    Talks on phone: Not on file    Gets together: Not on file    Attends religious service: Not on file    Active member of club or organization: Not on file    Attends meetings of clubs or organizations: Not on file    Relationship status: Not on file  . Intimate partner violence:    Fear of current or ex partner: Not on file    Emotionally abused: Not on file    Physically abused: Not on file    Forced sexual activity: Not on file  Other Topics Concern  . Not on file  Social History Narrative  . Not on file    Family History  Problem Relation Age of Onset  . Lupus Mother   . Heart disease Father   . Heart attack Father   . Thyroid disease Sister   . Heart disease Maternal Grandmother      Review of Systems  Constitutional: Negative.  Negative for chills and fever.  HENT: Negative.  Negative for hearing loss and sore throat.   Eyes: Negative.  Negative for blurred vision.  Respiratory: Negative.  Negative for cough and shortness of breath.   Cardiovascular: Negative.  Negative for chest pain, palpitations and leg swelling.  Gastrointestinal: Negative.  Negative for abdominal pain, diarrhea, nausea and vomiting.  Genitourinary: Negative.   Musculoskeletal: Negative.  Negative for back pain, myalgias and neck pain.  Skin: Negative.  Negative for rash.  Neurological: Negative.  Negative for dizziness and headaches.  Endo/Heme/Allergies: Negative.   All other systems reviewed and are negative.   Vitals:   02/25/18 1056  BP: 99/61  Pulse: (!) 58  Resp: 16  Temp: 98.8 F (37.1 C)  SpO2: 100%    Physical Exam  Constitutional: She is oriented to person, place, and time. She appears  well-developed and well-nourished.  HENT:  Head: Normocephalic and atraumatic.  Right Ear: External ear normal.  Left Ear: External ear normal.  Nose: Nose normal.  Mouth/Throat: Oropharynx is clear and moist.  Eyes: Pupils are equal, round, and reactive to light. Conjunctivae and EOM are normal.  Neck: Normal range of motion. Neck supple. No JVD present. No thyromegaly present.  Cardiovascular: Normal rate, regular rhythm, normal heart sounds and intact distal pulses.  Pulmonary/Chest: Effort normal and breath sounds normal.  Abdominal: Soft. Bowel sounds are normal. She exhibits no distension. There is no tenderness.  Musculoskeletal: Normal range of motion. She exhibits no edema or tenderness.  Lymphadenopathy:    She has no cervical adenopathy.  Neurological: She is alert and oriented to person, place, and time. No sensory deficit. She exhibits normal muscle tone.  Skin: Skin is warm and dry. Capillary refill takes less than 2 seconds.  Psychiatric: She has a normal mood and affect. Her behavior is normal.  Vitals reviewed.    ASSESSMENT & PLAN: Vida was seen today for annual exam.  Diagnoses and all orders for this visit:  Routine general medical examination at a health care facility  Screening for lipoid disorders -     Lipid panel  Screening for endocrine, metabolic and immunity disorder -     Lipid panel -     Comprehensive metabolic panel -     CBC -     Hemoglobin A1c   Patient Instructions   Health Maintenance, Female Adopting a healthy lifestyle and getting preventive care can go a long way to promote health and wellness. Talk with your health care provider about what schedule of regular examinations is right for you. This is a good chance for you to check in with your provider about disease prevention and staying healthy. In between checkups, there are plenty of things you can do on your own. Experts have done a lot of research about which lifestyle changes  and preventive measures are most likely to keep you healthy. Ask your health care provider for more information. Weight and diet Eat a healthy diet  Be sure to include plenty of vegetables, fruits, low-fat dairy products, and lean protein.  Do not eat a lot of foods high in solid fats, added sugars, or salt.  Get regular exercise. This is one of the most important things you can do for your health. ? Most adults should exercise for at least 150 minutes each week. The exercise should increase your heart rate and make you sweat (moderate-intensity exercise). ? Most adults should also do strengthening exercises at least twice a week. This is in addition to the moderate-intensity exercise.  Maintain a healthy weight  Body mass index (BMI) is a measurement that can be used to identify possible weight problems. It estimates body fat based on height and weight. Your health care provider can help determine your BMI and help you achieve or maintain a healthy weight.  For females 13 years of age and older: ? A BMI below 18.5 is considered underweight. ? A BMI of 18.5 to 24.9 is normal. ? A BMI of 25 to 29.9 is considered overweight. ? A BMI of 30 and above is considered obese.  Watch levels of cholesterol and blood lipids  You should start having your blood tested for lipids and cholesterol at 44 years of age, then have this test every 5 years.  You may need to have your cholesterol levels checked more often if: ? Your lipid or cholesterol levels are high. ? You are older than 44 years of age. ? You are at high risk for heart disease.  Cancer screening Lung Cancer  Lung cancer screening is recommended for adults 71-44 years old who are at high risk for lung cancer because of a history of smoking.  A yearly low-dose CT scan of the lungs is recommended for people who: ? Currently smoke. ? Have quit within the past 15 years. ? Have at least a 30-pack-year history of smoking. A pack year is  smoking an average of one pack of cigarettes a day for 1 year.  Yearly screening should continue until it has been 15 years since you quit.  Yearly screening should stop if you develop a health problem that would prevent you from having lung cancer treatment.  Breast Cancer  Practice breast self-awareness. This means understanding how your breasts normally appear and feel.  It also means doing regular breast self-exams. Let your health care provider know about any changes, no matter how small.  If you are in your 20s or 30s, you should have a clinical breast exam (CBE) by a health care provider every 1-3 years as part of a regular health exam.  If you are 59 or older, have a CBE every year. Also consider having a breast X-ray (mammogram) every year.  If you have a family history of breast cancer, talk to your health care provider about genetic screening.  If you are at high risk for breast cancer, talk to your health care provider about having an MRI and a mammogram every year.  Breast cancer gene (BRCA) assessment is recommended for women who have family members with BRCA-related cancers. BRCA-related cancers include: ? Breast. ? Ovarian. ? Tubal. ? Peritoneal cancers.  Results of the assessment will determine the need for genetic counseling and BRCA1 and BRCA2 testing.  Cervical Cancer Your health care provider may recommend that you be screened regularly for cancer of the pelvic organs (ovaries, uterus, and vagina). This screening involves a pelvic examination, including checking for microscopic changes to the surface of your cervix (Pap test). You may be encouraged to have this screening done every 3 years, beginning at age 58.  For women ages 19-65, health care providers may recommend pelvic exams and Pap testing every 3 years, or they may recommend the Pap and pelvic exam, combined with testing for human papilloma virus (HPV), every 5 years. Some types of HPV increase your risk  of cervical cancer. Testing for HPV may also be done on women of any age with unclear Pap test results.  Other health care providers may not recommend any screening for nonpregnant women  who are considered low risk for pelvic cancer and who do not have symptoms. Ask your health care provider if a screening pelvic exam is right for you.  If you have had past treatment for cervical cancer or a condition that could lead to cancer, you need Pap tests and screening for cancer for at least 20 years after your treatment. If Pap tests have been discontinued, your risk factors (such as having a new sexual partner) need to be reassessed to determine if screening should resume. Some women have medical problems that increase the chance of getting cervical cancer. In these cases, your health care provider may recommend more frequent screening and Pap tests.  Colorectal Cancer  This type of cancer can be detected and often prevented.  Routine colorectal cancer screening usually begins at 44 years of age and continues through 44 years of age.  Your health care provider may recommend screening at an earlier age if you have risk factors for colon cancer.  Your health care provider may also recommend using home test kits to check for hidden blood in the stool.  A small camera at the end of a tube can be used to examine your colon directly (sigmoidoscopy or colonoscopy). This is done to check for the earliest forms of colorectal cancer.  Routine screening usually begins at age 40.  Direct examination of the colon should be repeated every 5-10 years through 44 years of age. However, you may need to be screened more often if early forms of precancerous polyps or small growths are found.  Skin Cancer  Check your skin from head to toe regularly.  Tell your health care provider about any new moles or changes in moles, especially if there is a change in a mole's shape or color.  Also tell your health care  provider if you have a mole that is larger than the size of a pencil eraser.  Always use sunscreen. Apply sunscreen liberally and repeatedly throughout the day.  Protect yourself by wearing long sleeves, pants, a wide-brimmed hat, and sunglasses whenever you are outside.  Heart disease, diabetes, and high blood pressure  High blood pressure causes heart disease and increases the risk of stroke. High blood pressure is more likely to develop in: ? People who have blood pressure in the high end of the normal range (130-139/85-89 mm Hg). ? People who are overweight or obese. ? People who are African American.  If you are 46-56 years of age, have your blood pressure checked every 3-5 years. If you are 57 years of age or older, have your blood pressure checked every year. You should have your blood pressure measured twice-once when you are at a hospital or clinic, and once when you are not at a hospital or clinic. Record the average of the two measurements. To check your blood pressure when you are not at a hospital or clinic, you can use: ? An automated blood pressure machine at a pharmacy. ? A home blood pressure monitor.  If you are between 28 years and 39 years old, ask your health care provider if you should take aspirin to prevent strokes.  Have regular diabetes screenings. This involves taking a blood sample to check your fasting blood sugar level. ? If you are at a normal weight and have a low risk for diabetes, have this test once every three years after 44 years of age. ? If you are overweight and have a high risk for diabetes, consider being tested at  a younger age or more often. Preventing infection Hepatitis B  If you have a higher risk for hepatitis B, you should be screened for this virus. You are considered at high risk for hepatitis B if: ? You were born in a country where hepatitis B is common. Ask your health care provider which countries are considered high risk. ? Your  parents were born in a high-risk country, and you have not been immunized against hepatitis B (hepatitis B vaccine). ? You have HIV or AIDS. ? You use needles to inject street drugs. ? You live with someone who has hepatitis B. ? You have had sex with someone who has hepatitis B. ? You get hemodialysis treatment. ? You take certain medicines for conditions, including cancer, organ transplantation, and autoimmune conditions.  Hepatitis C  Blood testing is recommended for: ? Everyone born from [birth year] through 1967. ? Anyone with known risk factors for hepatitis C.  Sexually transmitted infections (STIs)  You should be screened for sexually transmitted infections (STIs) including gonorrhea and chlamydia if: ? You are sexually active and are younger than 44 years of age. ? You are older than 44 years of age and your health care provider tells you that you are at risk for this type of infection. ? Your sexual activity has changed since you were last screened and you are at an increased risk for chlamydia or gonorrhea. Ask your health care provider if you are at risk.  If you do not have HIV, but are at risk, it may be recommended that you take a prescription medicine daily to prevent HIV infection. This is called pre-exposure prophylaxis (PrEP). You are considered at risk if: ? You are sexually active and do not regularly use condoms or know the HIV status of your partner(s). ? You take drugs by injection. ? You are sexually active with a partner who has HIV.  Talk with your health care provider about whether you are at high risk of being infected with HIV. If you choose to begin PrEP, you should first be tested for HIV. You should then be tested every 3 months for as long as you are taking PrEP. Pregnancy  If you are premenopausal and you may become pregnant, ask your health care provider about preconception counseling.  If you may become pregnant, take 400 to 800 micrograms (mcg) of folic  acid every day.  If you want to prevent pregnancy, talk to your health care provider about birth control (contraception). Osteoporosis and menopause  Osteoporosis is a disease in which the bones lose minerals and strength with aging. This can result in serious bone fractures. Your risk for osteoporosis can be identified using a bone density scan.  If you are 76 years of age or older, or if you are at risk for osteoporosis and fractures, ask your health care provider if you should be screened.  Ask your health care provider whether you should take a calcium or vitamin D supplement to lower your risk for osteoporosis.  Menopause may have certain physical symptoms and risks.  Hormone replacement therapy may reduce some of these symptoms and risks. Talk to your health care provider about whether hormone replacement therapy is right for you. Follow these instructions at home:  Schedule regular health, dental, and eye exams.  Stay current with your immunizations.  Do not use any tobacco products including cigarettes, chewing tobacco, or electronic cigarettes.  If you are pregnant, do not drink alcohol.  If you are breastfeeding, limit  how much and how often you drink alcohol.  Limit alcohol intake to no more than 1 drink per day for nonpregnant women. One drink equals 12 ounces of beer, 5 ounces of wine, or 1 ounces of hard liquor.  Do not use street drugs.  Do not share needles.  Ask your health care provider for help if you need support or information about quitting drugs.  Tell your health care provider if you often feel depressed.  Tell your health care provider if you have ever been abused or do not feel safe at home. This information is not intended to replace advice given to you by your health care provider. Make sure you discuss any questions you have with your health care provider. Document Released: 10/07/2010 Document Revised: 08/30/2015 Document Reviewed:  12/26/2014 Elsevier Interactive Patient Education  2018 [place] (Health Maintenance, Female) Un estilo de vida saludable y los cuidados preventivos pueden favorecer considerablemente a la salud y Musician. Pregunte a su mdico cul es el cronograma de exmenes peridicos apropiado para usted. Esta es una buena oportunidad para consultarlo sobre cmo prevenir enfermedades y [place] sano. Adems de los controles, hay muchas otras cosas que puede hacer usted mismo. Los expertos han realizado numerosas investigaciones [employer] cambios en el estilo de vida y las medidas de prevencin que, [place], lo ayudarn a mantenerse sano. Solicite a su mdico ms informacin. EL PESO Y LA DIETA Consuma una dieta saludable.  Asegrese de [employer] verduras, frutas, productos lcteos de bajo contenido de [place] y Advertising account planner.  No consuma muchos alimentos de alto contenido de grasas slidas, azcares agregados o sal.  Realice actividad fsica con regularidad. Esta es una de las prcticas ms importantes que puede hacer por su salud. ? La Delorise Shiner de los adultos deben hacer ejercicio durante al menos 148mnutos por semana. El ejercicio debe aumentar la frecuencia cardaca y pActorla transpiracin (ejercicio de i[place]. ? La mayora de los adultos tambin deben hacer ejercicios de elongacin al mToysRusveces a la semana. Agregue esto al su plan de ejercicio de intensidad moderada. Mantenga un peso saludable.  El ndice de masa corporal ([facility] es una medida que puede utilizarse para identificar posibles problemas de pProspect Proporciona una estimacin de la grasa corporal basndose en el peso y la altura. Su mdico puede ayudarle a dRadiation protection practitionerIBushnelly a lScientist, forensico mTheatre managerun peso saludable.  Para las mujeres de 20aos o ms: ? Un ITexas Health Harris Methodist Hospital Fort Worthmenor de 18,5 se considera bajo peso. ? Un IOmega Hospitalentre 18,5 y 24,9 es normal. ? Un IColumbia Basin Hospitalentre 25 y 29,9  se considera sobrepeso. ? Un IMC de 30 o ms se considera obesidad. Observe los niveles de colesterol y lpidos en la sangre.  Debe comenzar a rEnglish as a second language teacherde lpidos y cResearch officer, trade unionen la sangre a los 20aos y luego repetirlos cada 58aos  Es posible que nAutomotive engineerlos niveles de colesterol con mayor frecuencia si: ? Sus niveles de lpidos y colesterol son altos. ? Es mayor de 529JME ? Presenta un alto riesgo de padecer enfermedades cardacas. DETECCIN DE CNCER Cncer de pulmn  Se recomienda realizar exmenes de deteccin de cncer de pulmn a personas adultas entre 570y 851aos que estn en riesgo de dHorticulturist, commercialde pulmn por sus antecedentes de consumo de tabaco.  Se recomienda una tomografa computarizada de baja dosis de los pulmones todos los aos a las personas que: ? Fuman actualmente. ? Hayan  dejado el hbito en algn momento en los ltimos 15aos. ? Hayan fumado durante 30aos un paquete diario. Un paquete-ao equivale a fumar un promedio de un paquete de cigarrillos diario durante un ao.  Los exmenes de deteccin anuales deben continuar hasta que hayan pasado 15aos desde que dej de fumar.  Ya no debern realizarse si tiene un problema de salud que le impida recibir tratamiento para Science writer de pulmn. Cncer de mama  Practique la autoconciencia de la mama. Esto significa reconocer la apariencia normal de sus mamas y cmo las siente.  Tambin significa realizar autoexmenes regulares de [employer]. Informe a su mdico sobre cualquier cambio, sin importar cun pequeo sea.  Si tiene entre 20 y 72 aos, un mdico debe realizarle un examen clnico de las mamas como parte del examen regular de [place], cada 1 a 3aos.  Si tiene 40aos o ms, debe Information systems manager clnico de las [employer]. Tambin considere realizarse una [place] todos los [place].  Si tiene antecedentes familiares de cncer de mama,  hable con su mdico para someterse a un estudio gentico.  Si tiene alto riesgo de Chief Financial Officer de mama, hable con su mdico para someterse a Public house manager y [employer].  La evaluacin del gen del cncer de mama (BRCA) se recomienda a mujeres que tengan familiares con cnceres relacionados con el BRCA. Los cnceres relacionados con el BRCA incluyen los siguientes: ? [place]. ? Ovario. ? Trompas. ? Cnceres de peritoneo.  Los resultados de la evaluacin determinarn la necesidad de asesoramiento gentico y de [place] de BRCA1 y BRCA2. Cncer de cuello del tero El mdico puede recomendarle que se haga pruebas peridicas de deteccin de cncer de los rganos de la pelvis (ovarios, tero y vagina). Estas pruebas incluyen un examen plvico, que abarca controlar si se produjeron cambios microscpicos en la superficie del cuello del tero (prueba de Papanicolaou). Pueden recomendarle que se haga estas pruebas cada 3aos, a partir de los 21aos.  A las mujeres que tienen entre 30 y 44aos, los mdicos pueden recomendarles que se sometan a exmenes plvicos y pruebas de Papanicolaou cada 81aos, o a la prueba de Papanicolaou y el examen plvico en combinacin con estudios de deteccin del virus del papiloma humano (VPH) cada 5aos. Algunos tipos de VPH aumentan el riesgo de Chief Financial Officer de cuello del tero. La prueba para la deteccin del VPH tambin puede realizarse a mujeres de cualquier edad cuyos resultados de la prueba de Papanicolaou no sean claros.  Es posible que otros mdicos no recomienden exmenes de deteccin a mujeres no embarazadas que se consideran sujetos de bajo riesgo de Chief Financial Officer de pelvis y que no tienen sntomas. Pregntele al mdico si un examen plvico de deteccin es adecuado para usted.  Si ha recibido un tratamiento para Science writer cervical o una enfermedad que podra causar cncer, necesitar realizarse una prueba de Papanicolaou y  controles durante al menos 47 aos de concluido el [place]. Si no se ha hecho el Papanicolaou con regularidad, debern volver a evaluarse los factores de riesgo (como tener un nuevo compaero sexual), para Teacher, adult education si debe realizarse los estudios nuevamente. Algunas mujeres sufren problemas mdicos que aumentan la probabilidad de Museum/gallery curator cncer de cuello del tero. En estos casos, el mdico podr [employer] se realicen controles y pruebas de Papanicolaou con ms frecuencia. Cncer colorrectal  Este tipo de cncer puede detectarse y a menudo prevenirse.  Por lo general, los  estudios de rutina se Sales promotion account executive a Field seismologist a Proofreader de los 1 aos y [place] 24 aos.  Sin embargo, el mdico podr aconsejarle que lo haga antes, si tiene factores de riesgo para el cncer de colon.  Tambin puede recomendarle que use un kit de prueba para Hydrologist en la materia fecal.  Es posible que se use una pequea cmara en el extremo de un tubo para examinar directamente el colon (sigmoidoscopia o colonoscopia) a fin de Hydrographic surveyor formas tempranas de cncer colorrectal.  Los exmenes de rutina generalmente comienzan a los 60aos.  El examen directo del colon se debe repetir cada 5 a 10aos hasta los 75aos. Sin embargo, es posible que se realicen exmenes con mayor frecuencia, si se detectan formas tempranas de plipos precancerosos o pequeos bultos. Cncer de piel  Revise la piel de la cabeza a los pies con regularidad.  Informe a su mdico si aparecen nuevos lunares o los que tiene se modifican, especialmente en su forma y color.  Tambin notifique al mdico si tiene un lunar que es ms grande que el tamao de una goma de lpiz.  Siempre use pantalla solar. Aplique pantalla solar de Kerry Dory y repetida a lo largo del Training and development officer.  Protjase usando mangas y [employer], un sombrero de ala ancha y gafas para el sol, siempre que se encuentre en el exterior. ENFERMEDADES CARDACAS,  DIABETES E HIPERTENSIN ARTERIAL  La hipertensin arterial causa enfermedades cardacas y [place] el riesgo de ictus. La hipertensin arterial es ms probable en los siguientes casos: ? Las personas que tienen la presin arterial en el extremo del rango normal (100-139/85-89 mm Hg). ? [employer] con sobrepeso u obesidad. ? Scientist, water quality.  Si usted tiene entre 18 y 39 aos, debe medirse la presin arterial cada 3 a 5 aos. Si usted tiene 40 aos o ms, debe medirse la presin arterial [employer]. Debe medirse la presin arterial dos veces: una vez cuando est en un hospital o una clnica y la otra vez cuando est en otro sitio. Registre el promedio de [employer]. Para controlar su presin arterial cuando no est en un hospital o [place], puede usar lo siguiente: ? Jorje Guild automtica para medir la presin arterial en una farmacia. ? Un monitor para medir la presin arterial en el hogar.  Si tiene entre 42 y 15 aos, consulte a su mdico si debe tomar aspirina para prevenir el ictus.  Realcese exmenes de deteccin de la diabetes con regularidad. Esto incluye la toma de [place] de sangre para controlar el nivel de azcar en la sangre durante el [place]. ? Si tiene un peso normal y un bajo riesgo de padecer diabetes, realcese este anlisis cada tres aos despus de los 45aos. ? Si tiene sobrepeso y un alto riesgo de padecer diabetes, considere someterse a este anlisis antes o con mayor frecuencia. PREVENCIN DE INFECCIONES HepatitisB  Si tiene un riesgo ms alto de Museum/gallery curator hepatitis B, debe someterse a un examen de deteccin de este virus. Se considera que tiene un alto riesgo de contraer hepatitis B si: ? Naci en un pas donde la hepatitis B es frecuente. Pregntele a su mdico qu pases son considerados de Public affairs consultant. ? Sus padres nacieron en un pas de alto riesgo y usted no recibi una vacuna que lo proteja contra la hepatitis B (vacuna contra la  hepatitis B). ? [place]. ? [place] agujas para inyectarse drogas. ? Vive con  alguien que tiene hepatitis B. ? Ha tenido sexo con alguien que tiene hepatitis B. ? Recibe tratamiento de hemodilisis. ? Toma ciertos medicamentos para el cncer, trasplante de rganos y afecciones autoinmunitarias. Hepatitis C  Se recomienda un anlisis de [place] para: ? [employer] 1947 y 1967. ? Todas las personas que tengan un riesgo de haber contrado hepatitis C. Enfermedades de transmisin sexual (ETS).  Debe realizarse pruebas de deteccin de enfermedades de transmisin sexual (ETS), incluidas gonorrea y clamidia si: ? Es sexualmente activo y es menor de 38BOF. ? Es mayor de 24aos, y Investment banker, operational informa que corre riesgo de tener este tipo de infecciones. ? La actividad sexual ha cambiado desde que le hicieron la ltima prueba de deteccin y tiene un riesgo mayor de Best boy clamidia o Radio broadcast assistant. Pregntele al mdico si usted tiene riesgo.  Si no tiene el VIH, pero corre riesgo de infectarse por el virus, se recomienda tomar diariamente un medicamento recetado para evitar la infeccin. Esto se conoce como profilaxis previa a la exposicin. Se considera que est en riesgo si: ? Es [place] sexualmente y no [place] preservativos habitualmente o no conoce el estado del VIH de sus Advertising copywriter. ? Se inyecta drogas. ? Es [place] sexualmente con Ardelia Mems pareja que tiene VIH. Consulte a su mdico para saber si tiene un alto riesgo de infectarse por el VIH. Si opta por comenzar la profilaxis previa a la exposicin, primero debe realizarse anlisis de deteccin del VIH. Luego, le harn anlisis cada 43mses mientras est tomando los medicamentos para la profilaxis previa a la exposicin. [facility] Si es premenopusica y puede quedar eHelena solicite a su mdico asesoramiento previo a la concepcin.  Si puede quedar embarazada, tome 400 a [id number](mcg) de cido fNew York Life Insurance  Si desea evitar el embarazo, hable con su mdico sobre el control de la natalidad (anticoncepcin). OSTEOPOROSIS Y MENOPAUSIA  La osteoporosis es una enfermedad en la que los huesos pierden los minerales y la fuerza por el avance de la edad. El resultado pueden ser fracturas graves en los hNorth Garden El riesgo de osteoporosis puede identificarse con uArdelia Memsprueba de densidad sea.  Si tiene 65aos o ms, o si est en riesgo de sufrir osteoporosis y fracturas, pregunte a su mdico si debe someterse a exmenes.  Consulte a su mdico si debe tomar un suplemento de calcio o de vitamina D para reducir el riesgo de osteoporosis.  La menopausia puede presentar ciertos sntomas fsicos y rGaffer  La terapia de reemplazo hormonal puede reducir algunos de estos sntomas y rGaffer Consulte a su mdico para saber si la terapia de reemplazo hormonal es conveniente para usted. INSTRUCCIONES PARA EL CUIDADO EN EL HOGAR  Realcese los estudios de rutina de la salud, dentales y de lPublic librarian  [place]  No consuma ningn producto que contenga tabaco, lo que incluye cigarrillos, tabaco de mHigher education careers advisero cPsychologist, sport and exercise  Si est embarazada, no beba alcohol.  Si est amamantando, reduzca el consumo de alcohol y la frecuencia con la que consume.  Si es mujer y no est embarazada limite el consumo de alcohol a no ms de 1 medida por da. Una medida equivale a 12onzas de cerveza, 5onzas de vino o 1onzas de bebidas alcohlicas de alta graduacin.  No consuma drogas.  No comparta agujas.  Solicite ayuda a su mdico si necesita apoyo o informacin para abandonar las drogas.  Informe a su mdico si a menudo se  siente deprimido.  Notifique a su mdico si alguna vez ha sido vctima de abuso o si no se siente seguro en su hogar. Esta informacin no tiene Marine scientist el consejo del mdico. Asegrese de hacerle al mdico cualquier pregunta que tenga. Document  Released: 03/13/2011 Document Revised: 04/14/2014 Document Reviewed: 12/26/2014 Elsevier Interactive Patient Education  2018 Elsevier Inc.      Agustina Caroli, MD [employer]

## 2018-02-26 LAB — CBC
Hematocrit: 40.8 % (ref 34.0–46.6)
Hemoglobin: 13.2 g/dL (ref 11.1–15.9)
MCH: 27.4 pg (ref 26.6–33.0)
MCHC: 32.4 g/dL (ref 31.5–35.7)
MCV: 85 fL (ref 79–97)
PLATELETS: 299 10*3/uL (ref 150–450)
RBC: 4.82 x10E6/uL (ref 3.77–5.28)
RDW: 12.8 % (ref 12.3–15.4)
WBC: 5 10*3/uL (ref 3.4–10.8)

## 2018-02-26 LAB — LIPID PANEL
CHOLESTEROL TOTAL: 176 mg/dL (ref 100–199)
Chol/HDL Ratio: 3.7 ratio (ref 0.0–4.4)
HDL: 48 mg/dL (ref >39)
LDL CALC: 114 mg/dL — AB (ref 0–99)
TRIGLYCERIDES: 68 mg/dL (ref 0–149)
VLDL Cholesterol Cal: 14 mg/dL (ref 5–40)

## 2018-02-26 LAB — COMPREHENSIVE METABOLIC PANEL
A/G RATIO: 1.8 (ref 1.2–2.2)
ALT: 13 IU/L (ref 0–32)
AST: 16 IU/L (ref 0–40)
Albumin: 4.4 g/dL (ref 3.5–5.5)
Alkaline Phosphatase: 79 IU/L (ref 39–117)
BUN/Creatinine Ratio: 12 (ref 9–23)
BUN: 8 mg/dL (ref 6–24)
Bilirubin Total: 0.3 mg/dL (ref 0.0–1.2)
CO2: 21 mmol/L (ref 20–29)
Calcium: 9.5 mg/dL (ref 8.7–10.2)
Chloride: 98 mmol/L (ref 96–106)
Creatinine, Ser: 0.66 mg/dL (ref 0.57–1.00)
GFR, EST AFRICAN AMERICAN: 124 mL/min/{1.73_m2} (ref >59)
GFR, EST NON AFRICAN AMERICAN: 108 mL/min/{1.73_m2} (ref >59)
GLOBULIN, TOTAL: 2.4 g/dL (ref 1.5–4.5)
Glucose: 83 mg/dL (ref 65–99)
POTASSIUM: 4.3 mmol/L (ref 3.5–5.2)
SODIUM: 139 mmol/L (ref 134–144)
TOTAL PROTEIN: 6.8 g/dL (ref 6.0–8.5)

## 2018-02-26 LAB — HEMOGLOBIN A1C
Est. average glucose Bld gHb Est-mCnc: 97 mg/dL
HEMOGLOBIN A1C: 5 % (ref 4.8–5.6)

## 2018-04-02 NOTE — Progress Notes (Deleted)
Office Visit Note  Patient: Tara Caldwell             Date of Birth: [birth year]           MRN: [id number]             PCP: Horald Pollen, MD Referring: Horald Pollen, * Visit Date: 04/16/2018 Occupation: @GUAROCC @  Subjective:  No chief complaint on file.   History of Present Illness: Tara Caldwell is a 42 y.o. female ***   Activities of Daily Living:  Patient reports morning stiffness for *** {minute/hour:19697}.   Patient {ACTIONS;DENIES/REPORTS:21021675::"Denies"} nocturnal pain.  Difficulty dressing/grooming: {ACTIONS;DENIES/REPORTS:21021675::"Denies"} Difficulty climbing stairs: {ACTIONS;DENIES/REPORTS:21021675::"Denies"} Difficulty getting out of chair: {ACTIONS;DENIES/REPORTS:21021675::"Denies"} Difficulty using hands for taps, buttons, cutlery, and/or writing: {ACTIONS;DENIES/REPORTS:21021675::"Denies"}  No Rheumatology ROS completed.   PMFS History:  Patient Active Problem List   Diagnosis Date Noted  . Primary osteoarthritis of both knees 07/17/2017  . Primary osteoarthritis of both hands 07/17/2017  . Chronic midline low back pain without sciatica 07/17/2017  . Positive ANA (antinuclear antibody) 07/17/2017  . History of thyroid disorder 04/21/2017  . Viral upper respiratory tract infection 04/21/2017  . Sinus headache 04/21/2017  . General weakness 04/21/2017  . Family history of systemic lupus erythematosus (SLE) in mother 04/21/2017    Past Medical History:  Diagnosis Date  . Asthma     Family History  Problem Relation Age of Onset  . Lupus Mother   . Heart disease Father   . Heart attack Father   . Thyroid disease Sister   . Heart disease Maternal Grandmother    No past surgical history on file. Social History   Social History Narrative  . Not on file    Objective: Vital Signs: There were no vitals taken for this visit.   Physical Exam   Musculoskeletal Exam: ***  CDAI Exam: CDAI Score:  Not documented Patient Global Assessment: Not documented; Provider Global Assessment: Not documented Swollen: Not documented; Tender: Not documented Joint Exam   Not documented   There is currently no information documented on the homunculus. Go to the Rheumatology activity and complete the homunculus joint exam.  Investigation: No additional findings.  Imaging: No results found.  Recent Labs: Lab Results  Component Value Date   WBC 5.0 02/25/2018   HGB 13.2 02/25/2018   PLT 299 02/25/2018   NA 139 02/25/2018   K 4.3 02/25/2018   CL 98 02/25/2018   CO2 21 02/25/2018   GLUCOSE 83 02/25/2018   BUN 8 02/25/2018   CREATININE 0.66 02/25/2018   BILITOT 0.3 02/25/2018   ALKPHOS 79 02/25/2018   AST 16 02/25/2018   ALT 13 02/25/2018   PROT 6.8 02/25/2018   ALBUMIN 4.4 02/25/2018   CALCIUM 9.5 02/25/2018   GFRAA 124 02/25/2018    Speciality Comments: No specialty comments available.  Procedures:  No procedures performed Allergies: Poison ivy extract   Assessment / Plan:     Visit Diagnoses: No diagnosis found.   Orders: No orders of the defined types were placed in this encounter.  No orders of the defined types were placed in this encounter.   Face-to-face time spent with patient was *** minutes. Greater than 50% of time was spent in counseling and coordination of care.  Follow-Up Instructions: No follow-ups on file.   Earnestine Mealing, CMA  Note - This record has been created using Editor, commissioning.  Chart creation errors have been sought, but may not always  have been  located. Such creation errors do not reflect on  the standard of medical care.

## 2018-04-08 ENCOUNTER — Encounter: Payer: Self-pay | Admitting: Gynecology

## 2018-04-08 ENCOUNTER — Ambulatory Visit: Payer: BLUE CROSS/BLUE SHIELD | Admitting: Gynecology

## 2018-04-08 VITALS — BP 118/74

## 2018-04-08 DIAGNOSIS — Z30432 Encounter for removal of intrauterine contraceptive device: Secondary | ICD-10-CM | POA: Diagnosis not present

## 2018-04-08 NOTE — Patient Instructions (Signed)
Follow-up for Nexplanon placement in several months

## 2018-04-08 NOTE — Progress Notes (Signed)
    Tara Caldwell [birth year] [id number]        42 y.o.  [id number] presents with [name] provided interpreter to have her IUD removed.  Had Mirena IUD placed 01/2017.  Remains amenorrheic.  Husband is able to feel the strings.  She feels she is having more headaches and pelvic cramping since placement.  Had used Nexplanon twice before and did well with this.  Lastly she is not comfortable having no menses and she had regular menses with her Nexplanon.  Past medical history,surgical history, problem list, medications, allergies, family history and social history were all reviewed and documented in the EPIC chart.  Directed ROS with pertinent positives and negatives documented in the history of present illness/assessment and plan.  Exam: [name] assistant Vitals:   04/08/18 1535  BP: 118/74   General appearance:  Normal [name] soft nontender without masses guarding rebound Pelvic external BUS vagina normal.  Cervix normal.  The IUD string was grasped with a [place] forcep and her Mirena IUD was removed, shown to the patient and discarded.  Uterus grossly normal size midline mobile nontender.  Adnexa without masses or tenderness.  Assessment/Plan:  43 y.o. [id number] with issues with her Mirena IUD to include pelvic cramping, headaches, having no menses and husband feeling the string.  Wants her Mirena IUD removed which was accomplished as above.  Has used Nexplanon in the past with good results.  Recommended that she monitor for the next several months to establish a baseline and then re-present for Nexplanon placement.  Backup contraception in the interim.    Anastasio Auerbach MD, 3:50 PM 04/08/2018

## 2018-04-16 ENCOUNTER — Ambulatory Visit: Payer: BLUE CROSS/BLUE SHIELD | Admitting: Rheumatology

## 2018-07-04 ENCOUNTER — Other Ambulatory Visit: Payer: Self-pay | Admitting: Rheumatology

## 2018-09-02 DIAGNOSIS — Z79899 Other long term (current) drug therapy: Secondary | ICD-10-CM | POA: Diagnosis not present

## 2018-09-02 DIAGNOSIS — M359 Systemic involvement of connective tissue, unspecified: Secondary | ICD-10-CM | POA: Diagnosis not present

## 2018-09-02 DIAGNOSIS — R002 Palpitations: Secondary | ICD-10-CM | POA: Diagnosis not present

## 2018-12-28 ENCOUNTER — Encounter: Payer: Self-pay | Admitting: Gynecology

## 2019-03-29 DIAGNOSIS — Z1231 Encounter for screening mammogram for malignant neoplasm of breast: Secondary | ICD-10-CM | POA: Diagnosis not present

## 2019-03-29 LAB — HM MAMMOGRAPHY

## 2019-04-05 ENCOUNTER — Encounter: Payer: Self-pay | Admitting: *Deleted

## 2019-04-12 DIAGNOSIS — Z79899 Other long term (current) drug therapy: Secondary | ICD-10-CM | POA: Diagnosis not present

## 2019-05-16 ENCOUNTER — Other Ambulatory Visit: Payer: Self-pay

## 2019-05-16 ENCOUNTER — Ambulatory Visit (INDEPENDENT_AMBULATORY_CARE_PROVIDER_SITE_OTHER): Payer: BC Managed Care – PPO | Admitting: Obstetrics and Gynecology

## 2019-05-16 ENCOUNTER — Encounter: Payer: Self-pay | Admitting: Obstetrics and Gynecology

## 2019-05-16 VITALS — BP 118/74 | Ht 61.0 in | Wt 162.0 lb

## 2019-05-16 DIAGNOSIS — Z01419 Encounter for gynecological examination (general) (routine) without abnormal findings: Secondary | ICD-10-CM

## 2019-05-16 DIAGNOSIS — Z124 Encounter for screening for malignant neoplasm of cervix: Secondary | ICD-10-CM | POA: Diagnosis not present

## 2019-05-16 NOTE — Patient Instructions (Signed)
Please remember to schedule your annual mammogram this year. Please make an appointment to come back to get the Nexplanon birth control placed Think about the sexual discomfort you have been having and what part of intercourse is uncomfortable so we can discuss this at your next visit.

## 2019-05-16 NOTE — Progress Notes (Signed)
   Tara Caldwell [birth year] [id number]  SUBJECTIVE:  44 y.o. [id number] female for annual routine gynecologic exam and Pap smear. She would like to get back on birth control and would like to try the Nexplanon.  She had a Mirena IUD removed after trying it for about 2 years but felt like she was having more headaches and pelvic cramping with the IUD, so she had it removed 04/2018.  Spanish interpreter present for the encounter.  Current Outpatient Medications  Medication Sig Dispense Refill  . hydroxychloroquine (PLAQUENIL) 200 MG tablet Take by mouth daily.     No current facility-administered medications for this visit.   Allergies: Poison ivy extract  Patient's last menstrual period was 05/02/2019.  Past medical history,surgical history, problem list, medications, allergies, family history and social history were all reviewed and documented as reviewed in the EPIC chart.  ROS:  Feeling well. No dyspnea or chest pain on exertion.  No abdominal pain, change in bowel habits, black or bloody stools.  No urinary tract symptoms. GYN ROS: normal menses, no abnormal bleeding, pelvic pain or discharge, no breast pain or new or enlarging lumps on self exam. No neurological complaints.    OBJECTIVE:  BP 118/74   Ht 5\' 1"  (1.549 m)   Wt 162 lb (73.5 kg)   LMP 05/02/2019   BMI 30.61 kg/m  The patient appears well, alert, oriented x 3, in no distress. ENT normal.  Neck supple. No cervical or supraclavicular adenopathy or thyromegaly.  Lungs are clear, good air entry, no wheezes, rhonchi or rales. S1 and S2 normal, no murmurs, regular rate and rhythm.  Abdomen soft without tenderness, guarding, mass or organomegaly.  Neurological is normal, no focal findings.   BREAST EXAM: breasts appear normal, no suspicious masses, no skin or nipple changes or axillary nodes  PELVIC EXAM: VULVA: normal appearing vulva with no masses, tenderness or lesions, VAGINA: normal appearing vagina with  normal color and discharge, no lesions, CERVIX: normal appearing cervix without discharge or lesions, UTERUS: uterus is normal size, shape, consistency and nontender, ADNEXA: normal adnexa in size, nontender and no masses, RECTAL: normal rectal, no masses  Chaperone: [name] present during the examination  ASSESSMENT:  44 y.o. [id number] here for annual gynecologic exam  PLAN:   1. Contraception. None currently, tried the IUD for a year or two and did not like it due to cramps/pain.  Would like to go back to Nexplanon, so she will make an appointment for this at her convenience.  Will check a urine pregnancy test prior to insertion. 2. Dyspareunia.  I encouraged her to think about her symptoms so we can discuss this in more detail next visit.  No abnormalities or discomfort on exam today. 3. Pap smear 04/2016. Pap smear is repeated today. No prior history of abnormal Pap smears.  4. Mammogram 03/2019. Will continue with annual mammography. Breast exam normal today. 5. Health maintenance.  No lab work as she has this completed with her primary care provider.    Return annually or sooner, prn.  Joseph Pierini MD  05/16/19

## 2019-05-17 LAB — PAP IG W/ RFLX HPV ASCU

## 2019-05-18 NOTE — Progress Notes (Signed)
Please let Tara Caldwell know that her Pap smear is normal.

## 2019-06-23 ENCOUNTER — Other Ambulatory Visit: Payer: Self-pay

## 2019-06-23 ENCOUNTER — Encounter: Payer: Self-pay | Admitting: Obstetrics and Gynecology

## 2019-06-23 ENCOUNTER — Ambulatory Visit: Payer: BC Managed Care – PPO | Admitting: Obstetrics and Gynecology

## 2019-06-23 VITALS — BP 122/74

## 2019-06-23 DIAGNOSIS — Z30017 Encounter for initial prescription of implantable subdermal contraceptive: Secondary | ICD-10-CM

## 2019-06-23 NOTE — Progress Notes (Signed)
   Tara Caldwell  22-Sep-1975 [id number]  HPI The patient is a 44 y.o. [id number] who presents today for Nexplanon insertion.  She is currently on her menstrual period.  After reviewing the contraceptive options she with the patient and counseling regarding pros and cons of the various different hormonal and nonhormonal birth control methods, the patient decided to have a Nexplanon placed. This was performed without difficulty.  She had been counseled regarding the risks of infection, hematoma and bruising, and nerve disruption in the upper arm.  There can be problems with the device migrating deeper into the tissues requiring surgical removal.  Side effects including menstrual bleeding irregularities and/or changes, mood disorder, weight gain, headache, acne, depression, and ovarian cysts were all reviewed with the patient.  Less common side effects including nausea and dizziness breast pain, abdominal pain were also reviewed.  She has previously had a Nexplanon.  BP 122/74   LMP 06/20/2019   Procedure note Nexplanon insertion The planned Nexplanon device insertion site was marked according to manufacturer's instructions 10 cm proximal to the medial epicondyle of the humerus on the inner side of the left arm over the previous scar.  The site was cleansed with Betadine swab.  2 mL of 1% lidocaine was injected just beneath the skin along the planned insertion tunnel.  A change to sterile gloves was made.  Maintaining sterility, the Nexplanon applicator needle was used to puncture the skin with the tip of the needle angled slightly less than 30 and the applicator was lowered to horizontal position while lifting the skin at the tip of the needle to slide the needle to its full length.  The slider is moved back fully and the implant was placed into its final subdermal position.  The presence of the implant was verified by palpation.  The site was then dressed in a normal fashion.  The patient  tolerated the procedure well.   She is excused from her job through Monday, 06/26/2019, to avoid disruption of the newly placed Nexplanon.  A work note is provided to her on paper.   Joseph Pierini MD, FACOG 06/23/19

## 2019-07-01 ENCOUNTER — Encounter: Payer: Self-pay | Admitting: Gynecology

## 2019-07-22 DIAGNOSIS — Z03818 Encounter for observation for suspected exposure to other biological agents ruled out: Secondary | ICD-10-CM | POA: Diagnosis not present

## 2019-07-22 DIAGNOSIS — Z20828 Contact with and (suspected) exposure to other viral communicable diseases: Secondary | ICD-10-CM | POA: Diagnosis not present

## 2019-12-05 DIAGNOSIS — Z0000 Encounter for general adult medical examination without abnormal findings: Secondary | ICD-10-CM | POA: Diagnosis not present

## 2019-12-08 ENCOUNTER — Encounter: Payer: BC Managed Care – PPO | Admitting: Family Medicine

## 2019-12-27 DIAGNOSIS — R079 Chest pain, unspecified: Secondary | ICD-10-CM | POA: Diagnosis not present

## 2019-12-27 DIAGNOSIS — Z20822 Contact with and (suspected) exposure to covid-19: Secondary | ICD-10-CM | POA: Diagnosis not present

## 2019-12-27 DIAGNOSIS — R0602 Shortness of breath: Secondary | ICD-10-CM | POA: Diagnosis not present

## 2019-12-27 DIAGNOSIS — R112 Nausea with vomiting, unspecified: Secondary | ICD-10-CM | POA: Diagnosis not present

## 2019-12-28 ENCOUNTER — Telehealth (INDEPENDENT_AMBULATORY_CARE_PROVIDER_SITE_OTHER): Payer: BC Managed Care – PPO | Admitting: Emergency Medicine

## 2019-12-28 ENCOUNTER — Encounter: Payer: Self-pay | Admitting: Emergency Medicine

## 2019-12-28 ENCOUNTER — Other Ambulatory Visit: Payer: Self-pay

## 2019-12-28 DIAGNOSIS — M329 Systemic lupus erythematosus, unspecified: Secondary | ICD-10-CM

## 2019-12-28 DIAGNOSIS — R079 Chest pain, unspecified: Secondary | ICD-10-CM | POA: Diagnosis not present

## 2019-12-28 NOTE — Progress Notes (Addendum)
Telemedicine Encounter- SOAP NOTE Established Patient Patient: Home  Provider: Office     This telephone encounter was conducted with the patient's (or proxy's) verbal consent via audio telecommunications: yes/no: Yes Patient was instructed to have this encounter in a suitably private space; and to only have persons present to whom they give permission to participate. In addition, patient identity was confirmed by use of name plus two identifiers (DOB and address).  I discussed the limitations, risks, security and privacy concerns of performing an evaluation and management service by telephone and the availability of in person appointments. I also discussed with the patient that there may be a patient responsible charge related to this service. The patient expressed understanding and agreed to proceed.  I spent a total of TIME; 0 MIN TO 60 MIN: 10 minutes talking with the patient or their proxy.  No chief complaint on file. Tara Phenix, Tara Caldwell - 12/27/2019 10:47 PM EDT Formatting of this note might be different from the original. Presents with chest pain and SOB today. She has been genally weak over the past week and feeling unwell. But CP and SOB started this afternoon. She also had some nausea and vomiting over the last week as well. Is not vaccinated. Denies sick contacts.   Electronically signed by Tara Phenix, Tara Caldwell at 12/27/2019 10:48 PM EDT   Martin Majestic to emergency room yesterday complaining of chest pain or difficulty breathing.  Negative Covid test.  Normal chest x-ray and normal EKG.  Negative troponins.  Normal blood work.  Sent home on ibuprofen and hydrocodone.  Told to follow-up with PCP.  Still feeling the same.  Has history of lupus.  Chest pain is steady, sharp, left-sided and worse with inspiration and palpation.  Subjective   Tara Caldwell Tara Caldwell is a 44 y.o. established patient. Telephone visit today for  HPI   Patient Active Problem List   Diagnosis  Date Noted  . Primary osteoarthritis of both knees 07/17/2017  . Primary osteoarthritis of both hands 07/17/2017  . Chronic midline low back pain without sciatica 07/17/2017  . Positive ANA (antinuclear antibody) 07/17/2017  . History of thyroid disorder 04/21/2017  . Viral upper respiratory tract infection 04/21/2017  . Sinus headache 04/21/2017  . General weakness 04/21/2017  . Family history of systemic lupus erythematosus (SLE) in mother 04/21/2017    Past Medical History:  Diagnosis Date  . Asthma   . Lupus ([place])     Current Outpatient Medications  Medication Sig Dispense Refill  . hydroxychloroquine (PLAQUENIL) 200 MG tablet Take by mouth daily.     No current facility-administered medications for this visit.    Allergies  Allergen Reactions  . Poison Ivy Extract Hives    Social History   Socioeconomic History  . Marital status: Married    Spouse name: Not on file  . Number of children: Not on file  . Years of education: Not on file  . Highest education level: Not on file  Occupational History  . Not on file  Tobacco Use  . Smoking status: Never Smoker  . Smokeless tobacco: Never Used  Vaping Use  . Vaping Use: Never used  Substance and Sexual Activity  . Alcohol use: No  . Drug use: No  . Sexual activity: Yes    Birth control/protection: None    Comment: 1st intercourse 48 yo-5 partners  Other Topics Concern  . Not on file  Social History Narrative  . Not on file   Social  Determinants of Health   Financial Resource Strain:   . Difficulty of Paying Living Expenses: Not on file  Food Insecurity:   . Worried About Charity fundraiser in the Last Year: Not on file  . Ran Out of Food in the Last Year: Not on file  Transportation Needs:   . Lack of Transportation (Medical): Not on file  . Lack of Transportation (Non-Medical): Not on file  Physical Activity:   . Days of Exercise per Week: Not on file  . Minutes of Exercise per Session: Not on file   Stress:   . Feeling of Stress : Not on file  Social Connections:   . Frequency of Communication with Friends and Family: Not on file  . Frequency of Social Gatherings with Friends and Family: Not on file  . Attends Religious Services: Not on file  . Active Member of Clubs or Organizations: Not on file  . Attends Archivist Meetings: Not on file  . Marital Status: Not on file  Intimate Partner Violence:   . Fear of Current or Ex-Partner: Not on file  . Emotionally Abused: Not on file  . Physically Abused: Not on file  . Sexually Abused: Not on file    Review of Systems  Constitutional: Negative.  Negative for chills and fever.  HENT: Negative.  Negative for congestion and sore throat.   Respiratory: Negative.  Negative for cough and shortness of breath.   Cardiovascular: Positive for chest pain. Negative for palpitations.  Gastrointestinal: Negative for nausea and vomiting.  Genitourinary: Negative.  Negative for dysuria.  Musculoskeletal: Negative.   Skin: Negative.  Negative for rash.  Neurological: Negative for dizziness and headaches.  All other systems reviewed and are negative.   Objective  Alert and oriented x3 in no apparent respiratory distress Vitals as reported by the patient: There were no vitals filed for this visit.  There are no diagnoses linked to this encounter. Diagnoses and all orders for this visit:  Nonspecific chest pain  History of systemic lupus erythematosus ([place])  Needs office visit this week.  Staff will call and schedule office visit.   I discussed the assessment and treatment plan with the patient. The patient was provided an opportunity to ask questions and all were answered. The patient agreed with the plan and demonstrated an understanding of the instructions.   The patient was advised to call back or seek an in-person evaluation if the symptoms worsen or if the condition fails to improve as anticipated.  I provided 10 minutes  of non-face-to-face time during this encounter.  Horald Pollen, MD  Primary Care at [facility]

## 2019-12-30 ENCOUNTER — Other Ambulatory Visit: Payer: Self-pay

## 2019-12-30 ENCOUNTER — Ambulatory Visit: Payer: BC Managed Care – PPO | Admitting: Family Medicine

## 2019-12-30 ENCOUNTER — Encounter: Payer: Self-pay | Admitting: Family Medicine

## 2019-12-30 VITALS — BP 118/70 | HR 77 | Temp 98.2°F | Ht 62.0 in | Wt 170.2 lb

## 2019-12-30 DIAGNOSIS — R0602 Shortness of breath: Secondary | ICD-10-CM

## 2019-12-30 DIAGNOSIS — M329 Systemic lupus erythematosus, unspecified: Secondary | ICD-10-CM | POA: Diagnosis not present

## 2019-12-30 DIAGNOSIS — R7989 Other specified abnormal findings of blood chemistry: Secondary | ICD-10-CM

## 2019-12-30 DIAGNOSIS — R0781 Pleurodynia: Secondary | ICD-10-CM

## 2019-12-30 DIAGNOSIS — R5383 Other fatigue: Secondary | ICD-10-CM | POA: Diagnosis not present

## 2019-12-30 MED ORDER — PREDNISONE 10 MG PO TABS: ORAL_TABLET | ORAL | 0 refills | Status: AC

## 2019-12-30 NOTE — Patient Instructions (Signed)
° ° ° °  If you have lab work done today you will be contacted with your lab results within the next 2 weeks.  If you have not heard from us then please contact us. The fastest way to get your results is to register for My Chart. ° ° °IF you received an x-ray today, you will receive an invoice from [facility]. Please contact [facility] at [phone] with questions or concerns regarding your invoice.  ° °IF you received labwork today, you will receive an invoice from [facility]. Please contact [facility] at [phone] with questions or concerns regarding your invoice.  ° °Our billing staff will not be able to assist you with questions regarding bills from these companies. ° °You will be contacted with the lab results as soon as they are available. The fastest way to get your results is to activate your My Chart account. Instructions are located on the last page of this paperwork. If you have not heard from us regarding the results in 2 weeks, please contact this office. °  ° ° ° °

## 2019-12-30 NOTE — Progress Notes (Signed)
9/24/20213:13 PM  Tara Caldwell [birth year], 44 y.o., female [id number]  Chief Complaint  Patient presents with  . Chest Pain    going on since monday  . shob    HPI:   Patient is a 44 y.o. female with past medical history significant for lupus who presents today for chest pain and SOB  Seen in ER sept 21 - negative workup, neg covid test Saw Dr Mitchel Honour telemedicine sept 22  Patient reports that she has been having sharp left chest pain, underneath her left breast Worse with deep breathing and rotational movement, radiates to her back Pain is not better or worse if she lies down or sits leaning forward For about 2 weeks has not been feeling well, very tired, fatigued Denies any interruption in plaquenil dosing Last rheum OV may 2020 - at [facility] Has nexplanon - amenorrhea Works constructions - road paving, drives heavy machinary Has been having rare vomiting in past 2 weeks Alternating diarrhea and constipation - nothing severe Denies sick contacts Normal CBC and CMP Normal trop, ekg and CXR Today feeling better - has been taking vicodin given in ER   Depression screen [facility] 2/9 12/30/2019 02/25/2018 04/21/2017  Decreased Interest 0 0 0  Down, Depressed, Hopeless 0 0 0  PHQ - 2 Score 0 0 0    Fall Risk  12/30/2019 02/25/2018 04/21/2017  Falls in the past year? 0 0 No  Number falls in past yr: 0 - -  Injury with Fall? 0 - -  Follow up Falls evaluation completed - -     Allergies  Allergen Reactions  . Poison Ivy Extract Hives    Prior to Admission medications   Medication Sig Start Date End Date Taking? Authorizing Provider  etonogestrel (NEXPLANON) 68 MG IMPL implant 1 each by Subdermal route once.   Yes [provider]  HYDROcodone-acetaminophen (NORCO/VICODIN) 5-325 MG tablet Take by mouth. 12/28/19 01/02/20 Yes [provider]  hydroxychloroquine (PLAQUENIL) 200 MG tablet Take by mouth daily.   Yes [provider]     Past Medical History:  Diagnosis Date  . Asthma   . Lupus ([place])     No past surgical history on file.  Social History   Tobacco Use  . Smoking status: Never Smoker  . Smokeless tobacco: Never Used  Substance Use Topics  . Alcohol use: No    Family History  Problem Relation Age of Onset  . Lupus Mother   . Breast cancer Mother   . Heart disease Father   . Heart attack Father   . Thyroid disease Sister   . Heart disease Maternal Grandmother     ROS Per hpi  OBJECTIVE:  Blood pressure 118/70, pulse 77, temperature 98.2 F (36.8 C), temperature source Temporal, height 5\' 2"  (1.575 m), weight 170 lb 3.2 oz (77.2 kg), last menstrual period 11/19/2019, SpO2 97 %.  Vitals:   12/30/19 1551 12/30/19 1552 12/30/19 1553 12/30/19 1554  BP:      Pulse: 65 82 83 77  Temp:      Height:      Weight:      SpO2: 98% 100% 96% 97%  TempSrc:      BMI (Calculated):        Body mass index is 31.13 kg/m.   Physical Exam Vitals and nursing note reviewed.  Constitutional:      Appearance: She is well-developed.  HENT:     Head: Normocephalic and atraumatic.     Mouth/Throat:  Pharynx: No oropharyngeal exudate.  Eyes:     General: No scleral icterus.    Extraocular Movements: Extraocular movements intact.     Conjunctiva/sclera: Conjunctivae normal.     Pupils: Pupils are equal, round, and reactive to light.  Cardiovascular:     Rate and Rhythm: Normal rate and regular rhythm.     Heart sounds: Normal heart sounds. No murmur heard.  No friction rub. No gallop.   Pulmonary:     Effort: Pulmonary effort is normal.     Breath sounds: Normal breath sounds. No wheezing, rhonchi or rales.  Chest:     Chest wall: No tenderness.  Abdominal:     General: Bowel sounds are normal.     Palpations: Abdomen is soft. There is no hepatomegaly or splenomegaly.     Tenderness: There is no abdominal tenderness.  Musculoskeletal:     Cervical back: Neck supple.     Right lower  leg: No tenderness. No edema.     Left lower leg: No tenderness. No edema.  Skin:    General: Skin is warm and dry.     Findings: No rash.  Neurological:     Mental Status: She is alert and oriented to person, place, and time.     No results found for this or any previous visit (from the past 24 hour(s)).  No results found.   ASSESSMENT and PLAN  1. Pleuritic chest pain Improving. Eval in ER reasuring. rx for steroids given in case worsening during the weekend.  Discussed this might be related to SLE and needs to re-establish with rheum  2. SOB (shortness of breath) Normal exam, CXR and 6 minute walk.  - D-dimer, quantitative (not at [facility])  3. History of systemic lupus erythematosus (HCC) - Sedimentation Rate - C-reactive protein - Urinalysis, Routine w reflex microscopic - Ambulatory referral to Rheumatology  4. Fatigue, unspecified type - TSH  Other orders - HYDROcodone-acetaminophen (NORCO/VICODIN) 5-325 MG tablet; Take by mouth. - etonogestrel (NEXPLANON) 68 MG IMPL implant; 1 each by Subdermal route once. - predniSONE (DELTASONE) 10 MG tablet; Take 4 tablets (40 mg total) by mouth daily with breakfast for 2 days, THEN 3 tablets (30 mg total) daily with breakfast for 2 days, THEN 2 tablets (20 mg total) daily with breakfast for 2 days, THEN 1 tablet (10 mg total) daily with breakfast for 2 days.   Return in about 2 months (around 02/29/2020).    Rutherford Guys, MD [facility] [place] [place], [place] [address] Ph.  [phone] Fax [phone]

## 2019-12-31 LAB — URINALYSIS, ROUTINE W REFLEX MICROSCOPIC
Bilirubin, UA: NEGATIVE
Glucose, UA: NEGATIVE
Ketones, UA: NEGATIVE
Leukocytes,UA: NEGATIVE
Nitrite, UA: NEGATIVE
RBC, UA: NEGATIVE
Specific Gravity, UA: 1.026 (ref 1.005–1.030)
Urobilinogen, Ur: 0.2 mg/dL (ref 0.2–1.0)
pH, UA: 6.5 (ref 5.0–7.5)

## 2019-12-31 LAB — MICROSCOPIC EXAMINATION
Bacteria, UA: NONE SEEN
Casts: NONE SEEN /lpf
RBC, Urine: NONE SEEN /hpf (ref 0–2)

## 2019-12-31 LAB — C-REACTIVE PROTEIN: CRP: 17 mg/L — ABNORMAL HIGH (ref 0–10)

## 2019-12-31 LAB — TSH: TSH: 6.65 u[IU]/mL — ABNORMAL HIGH (ref 0.450–4.500)

## 2019-12-31 LAB — D-DIMER, QUANTITATIVE: D-DIMER: 0.74 mg/L FEU — ABNORMAL HIGH (ref 0.00–0.49)

## 2019-12-31 LAB — SEDIMENTATION RATE: Sed Rate: 32 mm/hr (ref 0–32)

## 2020-01-01 DIAGNOSIS — R079 Chest pain, unspecified: Secondary | ICD-10-CM | POA: Diagnosis not present

## 2020-01-01 DIAGNOSIS — G44309 Post-traumatic headache, unspecified, not intractable: Secondary | ICD-10-CM | POA: Diagnosis not present

## 2020-01-01 DIAGNOSIS — R519 Headache, unspecified: Secondary | ICD-10-CM | POA: Diagnosis not present

## 2020-01-01 DIAGNOSIS — M542 Cervicalgia: Secondary | ICD-10-CM | POA: Diagnosis not present

## 2020-01-01 DIAGNOSIS — R110 Nausea: Secondary | ICD-10-CM | POA: Diagnosis not present

## 2020-01-01 DIAGNOSIS — R200 Anesthesia of skin: Secondary | ICD-10-CM | POA: Diagnosis not present

## 2020-01-01 DIAGNOSIS — Z3202 Encounter for pregnancy test, result negative: Secondary | ICD-10-CM | POA: Diagnosis not present

## 2020-01-01 DIAGNOSIS — H93A2 Pulsatile tinnitus, left ear: Secondary | ICD-10-CM | POA: Diagnosis not present

## 2020-01-02 ENCOUNTER — Telehealth: Payer: Self-pay | Admitting: Emergency Medicine

## 2020-01-02 NOTE — Telephone Encounter (Signed)
Pts husband called and stated that the referral for Rheumatology, they are needing the notes for pt. Please advise.

## 2020-01-02 NOTE — Telephone Encounter (Signed)
sent 

## 2020-01-03 NOTE — Addendum Note (Signed)
Addended by: Rutherford Guys on: 01/03/2020 10:39 AM   Modules accepted: Orders

## 2020-01-04 DIAGNOSIS — N390 Urinary tract infection, site not specified: Secondary | ICD-10-CM | POA: Diagnosis not present

## 2020-01-04 DIAGNOSIS — M791 Myalgia, unspecified site: Secondary | ICD-10-CM | POA: Diagnosis not present

## 2020-01-04 DIAGNOSIS — M2550 Pain in unspecified joint: Secondary | ICD-10-CM | POA: Diagnosis not present

## 2020-01-04 DIAGNOSIS — R201 Hypoesthesia of skin: Secondary | ICD-10-CM | POA: Diagnosis not present

## 2020-01-04 DIAGNOSIS — M359 Systemic involvement of connective tissue, unspecified: Secondary | ICD-10-CM | POA: Diagnosis not present

## 2020-02-29 ENCOUNTER — Ambulatory Visit: Payer: BC Managed Care – PPO | Admitting: Emergency Medicine

## 2020-03-12 ENCOUNTER — Ambulatory Visit: Payer: BC Managed Care – PPO | Admitting: Neurology

## 2020-05-11 ENCOUNTER — Encounter: Payer: Self-pay | Admitting: Family Medicine

## 2020-05-11 ENCOUNTER — Ambulatory Visit (INDEPENDENT_AMBULATORY_CARE_PROVIDER_SITE_OTHER): Payer: Self-pay | Admitting: Family Medicine

## 2020-05-11 ENCOUNTER — Other Ambulatory Visit: Payer: Self-pay

## 2020-05-11 VITALS — BP 114/78 | HR 86 | Temp 98.3°F | Resp 15 | Ht 62.0 in | Wt 172.8 lb

## 2020-05-11 DIAGNOSIS — B9689 Other specified bacterial agents as the cause of diseases classified elsewhere: Secondary | ICD-10-CM

## 2020-05-11 DIAGNOSIS — N898 Other specified noninflammatory disorders of vagina: Secondary | ICD-10-CM

## 2020-05-11 DIAGNOSIS — N760 Acute vaginitis: Secondary | ICD-10-CM

## 2020-05-11 LAB — POCT URINALYSIS DIP (MANUAL ENTRY)
Bilirubin, UA: NEGATIVE
Blood, UA: NEGATIVE
Glucose, UA: NEGATIVE mg/dL
Ketones, POC UA: NEGATIVE mg/dL
Nitrite, UA: NEGATIVE
Protein Ur, POC: NEGATIVE mg/dL
Spec Grav, UA: 1.01 (ref 1.010–1.025)
Urobilinogen, UA: 0.2 E.U./dL
pH, UA: 5.5 (ref 5.0–8.0)

## 2020-05-11 LAB — POCT WET + KOH PREP
Trich by wet prep: ABSENT
Yeast by KOH: ABSENT
Yeast by wet prep: ABSENT

## 2020-05-11 MED ORDER — METRONIDAZOLE 500 MG PO TABS: 500.0000 mg | ORAL_TABLET | Freq: Two times a day (BID) | ORAL | 0 refills | Status: DC

## 2020-05-11 MED ORDER — FLUCONAZOLE 150 MG PO TABS: ORAL_TABLET | ORAL | 0 refills | Status: DC

## 2020-05-11 NOTE — Progress Notes (Signed)
Patient ID: Tara Caldwell, female    DOB: 12-14-1975  Age: 45 y.o. MRN: [id number]  Chief Complaint  Patient presents with  . Vaginal Discharge    Pt had sexual intercourse with husband Saturday and on Sunday developed burning/itching, discharge, some pain with urination and low back pain on Sunday no better since and has tried monostat OTC     Subjective:   Patient had intercourse with her husband on Saturday.  She did use some vaginal lubricant which she usually does.  On Monday or Tuesday she developed itching and a little erythematous rash around the crease of the groin.  There is some discharge with white stuff in it.  Her husband has similar symptoms on his genitalia.  She did take some old Diflucan he had leftover from previously.  Current allergies, medications, problem list, past/family and social histories reviewed.  Objective:  BP 114/78   Pulse 86   Temp 98.3 F (36.8 C) (Temporal)   Resp 15   Ht 5\' 2"  (1.575 m)   Wt 172 lb 12.8 oz (78.4 kg)   SpO2 98%   BMI 31.61 kg/m   Vaginal swab was done to look for wet prep. Assessment & Plan:   Assessment: 1. Discharge from the vagina   2. Itching in the vaginal area       Plan: Do a KOH.  Orders Placed This Encounter  Procedures  . POCT urinalysis dipstick  . POCT Wet + KOH Prep    No orders of the defined types were placed in this encounter.        Patient Instructions       If you have lab work done today you will be contacted with your lab results within the next 2 weeks.  If you have not heard from [place] then please contact us. The fastest way to get your results is to register for My Chart.   IF you received an x-ray today, you will receive an invoice from [facility]. Please contact [facility] at [phone] with questions or concerns regarding your invoice.   IF you received labwork today, you will receive an invoice from [place]. Please contact [facility] at  [phone] with questions or concerns regarding your invoice.   Our billing staff will not be able to assist you with questions regarding bills from these companies.  You will be contacted with the lab results as soon as they are available. The fastest way to get your results is to activate your My Chart account. Instructions are located on the last page of this paperwork. If you have not heard from [place] regarding the results in 2 weeks, please contact this office.        No follow-ups on file.   Ruben Reason, MD 05/11/2020

## 2020-05-11 NOTE — Patient Instructions (Addendum)
Your test shows that you have a kind of infection of your vagina called bacterial vaginosis.  That may be causing some of the symptoms.  However since you took some of the yeast medicine (Diflucan) you very likely still could have had yeast that is not yet completely resolved.  I think it is best that we treat you for both.  Take Diflucan 100 mg 1 single dose.  You can repeat it in 2 or 3 days if needed.  Also take metronidazole 500 mg 1 twice daily for 7 days for bacterial vaginosis.  Drink lots of water.  Do not drink any alcohol while you are on the metronidazole.   Vaginosis bacteriana Bacterial Vaginosis  La vaginosis bacteriana es una infeccin de la vagina. Se produce cuando crece una cantidad excesiva de grmenes normales (bacterias sanas) en la vagina. Esta infeccin puede facilitar contraer otras infecciones por las relaciones sexuales (infecciones de transmisin sexual, ITS). Es muy importante que las mujeres embarazadas reciban tratamiento. Esta infeccin puede hacer que los bebs nazcan antes de tiempo o tengan un bajo peso al nacer. Cules son las causas? La causa de esta infeccin es el aumento de ciertas bacterias que crecen en la vagina. No se puede contraer esta infeccin en los asientos de inodoros, en las sbanas, en las piscinas ni en los objetos que entran en contacto con la vagina. Qu incrementa el riesgo?  Tener relaciones sexuales con una persona nueva o con ms de Arts administrator.  Tener relaciones sexuales sin proteccin.  Hacerse duchas vaginales.  Tener colocado un dispositivo intrauterino(DIU).  Fumar.  Consumir drogas o beber alcohol. Esto puede llevarla a hacer cosas que son [place].  Tomar ciertos antibiticos.  Estar embarazada. Cules son los signos o sntomas? Algunas mujeres no tienen sntomas. Entre los sntomas, se pueden incluir los siguientes:  Secrecin de la vagina. Puede ser gris o blanca. Puede ser acuosa o espumosa.  Olor a  pescado. Esto puede ocurrir despus de Office manager sexuales o durante el perodo menstrual.  Picazn en la vagina y a su alrededor.  Sensacin de ardor o dolor al hacer pis (orinar). Cmo se trata? Esta infeccin se trata con antibiticos. Estos pueden administrarse en las siguientes presentaciones:  [place].  Crema para la vagina.  Medicamento que se coloca dentro de la vagina (vulo vaginal). Si la infeccin reaparece despus del tratamiento, es posible que necesite ms antibiticos. Siga estas instrucciones en su casa: Medicamentos  Use los medicamentos de venta libre y los recetados como se lo haya indicado el [place] o use el antibitico como se lo haya indicado el mdico. No deje de tomarlo o usarlo aunque comience a sentirse mejor. Instrucciones generales  Si la persona con la que tiene relaciones sexuales es [place], dgale que usted tiene esta infeccin. Ella tendr que concurrir a visitas de control con el mdico. Si tiene una pareja sexual hombre, l no necesita tratamiento.  No mantenga relaciones sexuales Database administrator.  Beba suficiente lquido como para Theatre manager la orina de color amarillo plido.  Mantenga la vagina y el ano limpios. ? Lave la zona con agua tibia cada da. ? Cuando vaya al bao, higiencese de adelante hacia atrs.  Si est amamantando a un beb, pregunte al mdico si debera seguir Camera operator.  Cumpla con todas las visitas de seguimiento. Cmo se previene? Autocuidado  No se haga duchas vaginales.  Use nicamente agua tibia para lavar la zona alrededor de la vagina.  Use  ropa interior de algodn o [place] parte de adentro sea de algodn.  No use pantalones ajustados ni pantis; en especial, durante el verano. Sexo seguro  Use proteccin al [employer]. Esto puede comprender lo siguiente: ? Use preservativos. ? Use barreras bucales. Estas son capas delgadas de ltex que  protegen la boca durante el sexo oral.  Limite la cantidad de personas con las que tiene Armed forces operational officer. Para prevenir esta infeccin, lo mejor es tener relaciones sexuales con una sola persona.  Hgase exmenes de ITS. Tambin debe [employer] la persona con quien tiene Armed forces operational officer. Drogas y alcohol  No fume ni consuma ningn producto que contenga nicotina o tabaco. Si necesita ayuda para dejar de consumir estos productos, consulte al mdico.  No consuma drogas.  No beba alcohol si: ? El mdico le indica que no lo haga. ? Est embarazada, puede estar embarazada o est tratando de quedar embarazada.  Si bebe alcohol: ? Limite la cantidad que consume de 0 a 1 medida por da. ? Sepa cunta cantidad de alcohol hay en las bebidas que toma. En los [place], una medida equivale a una botella de cerveza de 12oz (376ml), un vaso de vino de 5oz (144ml) o un vaso de una bebida alcohlica de alta graduacin de 1oz (11ml). Dnde buscar ms informacin  Centers for Disease Control and Prevention (Centros para el Control y la Prevencin de Arboriculturist): http://www.wolf.info/  American Sexual Health Association (Asociacin Estadounidense de la Salud Sexual): www.ashastd.org  Office on [employer] ([place]): LegalWarrants.gl Comunquese con un mdico si:  Los sntomas no mejoran, incluso despus de Chiropodist.  Tiene ms secrecin o siente dolor al hacer pis.  Tiene fiebre o escalofros.  Tiene dolor en el vientre (abdomen) o en la zona que se encuentra entre las caderas.  Siente dolor durante el sexo.  Le sangra la vagina entre perodos menstruales. Resumen  Esta infeccin puede ocurrir cuando crecen demasiados grmenes (bacterias) en la vagina.  Esta infeccin puede facilitar contraer infecciones por las relaciones sexuales (infecciones de transmisin sexual, ITS). El tratamiento puede disminuir esa posibilidad.  Reciba  tratamiento si est embarazada. Esta infeccin puede hacer que los bebs nazcan antes de [place].  No deje de tomar ni de usar el antibitico aunque comience a sentirse mejor. Esta informacin no tiene Marine scientist el consejo del mdico. Asegrese de hacerle al mdico cualquier pregunta que tenga. Document Revised: 10/26/2019 Document Reviewed: 10/26/2019 Elsevier Patient Education  2021 [employer].     If you have lab work done today you will be contacted with your lab results within the next 2 weeks.  If you have not heard from [place] then please contact us. The fastest way to get your results is to register for My Chart.   IF you received an x-ray today, you will receive an invoice from [facility]. Please contact [facility] at [phone] with questions or concerns regarding your invoice.   IF you received labwork today, you will receive an invoice from [place]. Please contact [facility] at [phone] with questions or concerns regarding your invoice.   Our billing staff will not be able to assist you with questions regarding bills from these companies.  You will be contacted with the lab results as soon as they are available. The fastest way to get your results is to activate your My Chart account. Instructions are located on the last page of this paperwork. If you have not  heard from [place] regarding the results in 2 weeks, please contact this office.

## 2020-05-16 ENCOUNTER — Encounter: Payer: Self-pay | Admitting: Obstetrics and Gynecology

## 2020-09-13 ENCOUNTER — Ambulatory Visit: Payer: 59 | Admitting: Nurse Practitioner

## 2020-09-13 ENCOUNTER — Other Ambulatory Visit: Payer: Self-pay

## 2020-09-13 DIAGNOSIS — R635 Abnormal weight gain: Secondary | ICD-10-CM | POA: Diagnosis not present

## 2020-09-13 DIAGNOSIS — N898 Other specified noninflammatory disorders of vagina: Secondary | ICD-10-CM | POA: Diagnosis not present

## 2020-09-13 DIAGNOSIS — Z975 Presence of (intrauterine) contraceptive device: Secondary | ICD-10-CM

## 2020-09-13 DIAGNOSIS — R1032 Left lower quadrant pain: Secondary | ICD-10-CM | POA: Diagnosis not present

## 2020-09-13 DIAGNOSIS — N921 Excessive and frequent menstruation with irregular cycle: Secondary | ICD-10-CM

## 2020-09-13 NOTE — Progress Notes (Signed)
   Acute Office Visit  Subjective:    Patient ID: Tara Caldwell, female    DOB: 07-Aug-1975, 46 y.o.   MRN: [id number]   HPI 45 y.o. presents today for multiple problems. Since Nexplanon insertion 06/2019 she has had irregular bleeding sometimes lasting 2-4 weeks. Bleeding is not typically heavy but she did report during last episode she had clots. She also has had LLQ abdominal pain for months, she thinks since insertion. This is described as crampy and is usually associated with bleeding. She complains of vaginal dryness that causes pain with intercourse and 15-20 pound weight gain as well. She had IUD in the past with bloating and bleeding.    Review of Systems  Constitutional:  Positive for unexpected weight change.  Gastrointestinal:  Positive for abdominal pain. Negative for constipation, diarrhea, nausea and vomiting.  Genitourinary:  Positive for menstrual problem. Negative for vaginal discharge and vaginal pain.      Objective:    Physical Exam Constitutional:      Appearance: Normal appearance.  Abdominal:     Tenderness: There is no abdominal tenderness. There is no guarding or rebound.  Genitourinary:    General: Normal vulva.     Vagina: Normal.     Cervix: Normal.     Uterus: Normal.     There were no vitals taken for this visit. Wt Readings from Last 3 Encounters:  05/11/20 172 lb 12.8 oz (78.4 kg)  12/30/19 170 lb 3.2 oz (77.2 kg)  05/16/19 162 lb (73.5 kg)        Assessment & Plan:   Problem List Items Addressed This Visit   None Visit Diagnoses     Breakthrough bleeding on Nexplanon    -  Primary   LLQ pain       Weight gain       Vaginal dryness          Plan: Discussed that symptoms are likely related to Nexplanon. Contraceptive options were reviewed, including hormonal methods, both combination (pill, patch, vaginal ring) and progesterone-only (pill, Depo Provera), intrauterine devices (Mirena, [place], [place], and [place]),  barrier methods (condoms, diaphragm) and female/female sterilization. The mechanisms, risks, benefits and side effects of all methods were discussed.  She would like Nexplanon removed and to start patch. She will schedule appointment for removal. She is agreeable to plan.      [place], 1:38 PM 09/13/2020

## 2020-09-19 ENCOUNTER — Other Ambulatory Visit: Payer: Self-pay

## 2020-09-19 ENCOUNTER — Ambulatory Visit (INDEPENDENT_AMBULATORY_CARE_PROVIDER_SITE_OTHER): Payer: 59 | Admitting: Nurse Practitioner

## 2020-09-19 ENCOUNTER — Encounter: Payer: Self-pay | Admitting: Nurse Practitioner

## 2020-09-19 VITALS — BP 122/72

## 2020-09-19 DIAGNOSIS — Z30016 Encounter for initial prescription of transdermal patch hormonal contraceptive device: Secondary | ICD-10-CM

## 2020-09-19 DIAGNOSIS — Z3046 Encounter for surveillance of implantable subdermal contraceptive: Secondary | ICD-10-CM

## 2020-09-19 MED ORDER — NORELGESTROMIN-ETH ESTRADIOL 150-35 MCG/24HR TD PTWK
1.0000 | MEDICATED_PATCH | TRANSDERMAL | 3 refills | Status: DC
Start: 2020-09-19 — End: 2021-11-11

## 2020-09-19 NOTE — Progress Notes (Signed)
45 y.o. [id number] presents for Nexplanon removal.  She has been experiencing irregular prolonged bleeding and weight gain.  She has decided to use Ortho Evra patch for future contraception.  Procedure, risks and benefits have all been explained.  She has the following questions today:  How to use patch and how effective it is against pregnancy.     LMP:  Unknown due to irregular bleeding   After all questions were answered, consent was obtained.    Past Medical History:  Diagnosis Date   Asthma    Lupus ([place])     No past surgical history on file.  Current Outpatient Medications on File Prior to Visit  Medication Sig Dispense Refill   hydroxychloroquine (PLAQUENIL) 200 MG tablet Take by mouth daily.     No current facility-administered medications on file prior to visit.   Allergies  Allergen Reactions   Poison Ivy Extract Hives    Vitals:   09/19/20 1436  BP: 122/72   Physical Exam  Procedure: Patient placed supine on exam table with her left arm flexed at the elbow. The prior insertion site was located and the Nexplanon rod was palpated.  Area cleansed with Betadine x 3 and draped in normal sterile fashion.  Insertion site and surrounding tissue anesthetized with 1% Lidocaine without epinephrine, 1cc total used.  Small incision made with #11 blade.  Nexplanon removed without difficulty.  Steri-strips were applied and pressure dressing placed over the site.  Entire procedure performed with sterile technique.  Pt tolerated procedure well.  Assessment: Nexplanon removal  Plan:  Post procedure instructions reviewed with pt.  Questions answered.  Pt knows to call with any concerns or questions. Educated on proper use of Ortho Evra patches and 99% effectiveness against pregnancy if use correctly.   New contraception:  Ortho Evra patch   She is also due for annual visit and will schedule this soon.

## 2020-09-20 ENCOUNTER — Encounter: Payer: Self-pay | Admitting: Gynecology

## 2020-10-04 ENCOUNTER — Other Ambulatory Visit: Payer: Self-pay

## 2020-10-04 ENCOUNTER — Ambulatory Visit (INDEPENDENT_AMBULATORY_CARE_PROVIDER_SITE_OTHER): Payer: 59 | Admitting: Emergency Medicine

## 2020-10-04 ENCOUNTER — Encounter: Payer: Self-pay | Admitting: Emergency Medicine

## 2020-10-04 VITALS — BP 100/60 | HR 66 | Temp 98.8°F | Ht 62.0 in | Wt 167.8 lb

## 2020-10-04 DIAGNOSIS — Z1329 Encounter for screening for other suspected endocrine disorder: Secondary | ICD-10-CM | POA: Diagnosis not present

## 2020-10-04 DIAGNOSIS — Z13228 Encounter for screening for other metabolic disorders: Secondary | ICD-10-CM

## 2020-10-04 DIAGNOSIS — Z13220 Encounter for screening for lipoid disorders: Secondary | ICD-10-CM | POA: Diagnosis not present

## 2020-10-04 DIAGNOSIS — Z0000 Encounter for general adult medical examination without abnormal findings: Secondary | ICD-10-CM | POA: Diagnosis not present

## 2020-10-04 DIAGNOSIS — Z130 Encounter for screening for diseases of the blood and blood-forming organs and certain disorders involving the immune mechanism: Secondary | ICD-10-CM | POA: Diagnosis not present

## 2020-10-04 DIAGNOSIS — G8929 Other chronic pain: Secondary | ICD-10-CM

## 2020-10-04 DIAGNOSIS — L237 Allergic contact dermatitis due to plants, except food: Secondary | ICD-10-CM

## 2020-10-04 DIAGNOSIS — Z8639 Personal history of other endocrine, nutritional and metabolic disease: Secondary | ICD-10-CM | POA: Diagnosis not present

## 2020-10-04 DIAGNOSIS — M545 Low back pain, unspecified: Secondary | ICD-10-CM

## 2020-10-04 DIAGNOSIS — D8989 Other specified disorders involving the immune mechanism, not elsewhere classified: Secondary | ICD-10-CM | POA: Diagnosis not present

## 2020-10-04 DIAGNOSIS — M329 Systemic lupus erythematosus, unspecified: Secondary | ICD-10-CM

## 2020-10-04 LAB — COMPREHENSIVE METABOLIC PANEL
ALT: 50 U/L — ABNORMAL HIGH (ref 0–35)
AST: 28 U/L (ref 0–37)
Albumin: 4.1 g/dL (ref 3.5–5.2)
Alkaline Phosphatase: 76 U/L (ref 39–117)
BUN: 9 mg/dL (ref 6–23)
CO2: 28 mEq/L (ref 19–32)
Calcium: 8.9 mg/dL (ref 8.4–10.5)
Chloride: 103 mEq/L (ref 96–112)
Creatinine, Ser: 0.58 mg/dL (ref 0.40–1.20)
GFR: 108.21 mL/min (ref >60.00)
Glucose, Bld: 82 mg/dL (ref 70–99)
Potassium: 3.9 mEq/L (ref 3.5–5.1)
Sodium: 137 mEq/L (ref 135–145)
Total Bilirubin: 0.4 mg/dL (ref 0.2–1.2)
Total Protein: 7 g/dL (ref 6.0–8.3)

## 2020-10-04 LAB — URINALYSIS
Bilirubin Urine: NEGATIVE
Hgb urine dipstick: NEGATIVE
Ketones, ur: NEGATIVE
Leukocytes,Ua: NEGATIVE
Nitrite: NEGATIVE
Specific Gravity, Urine: 1.025 (ref 1.000–1.030)
Total Protein, Urine: NEGATIVE
Urine Glucose: NEGATIVE
Urobilinogen, UA: 0.2 (ref 0.0–1.0)
pH: 5.5 (ref 5.0–8.0)

## 2020-10-04 LAB — CBC WITH DIFFERENTIAL/PLATELET
Basophils Absolute: 0 10*3/uL (ref 0.0–0.1)
Basophils Relative: 0.6 % (ref 0.0–3.0)
Eosinophils Absolute: 0.2 10*3/uL (ref 0.0–0.7)
Eosinophils Relative: 4.3 % (ref 0.0–5.0)
HCT: 39.2 % (ref 36.0–46.0)
Hemoglobin: 13.1 g/dL (ref 12.0–15.0)
Lymphocytes Relative: 25.2 % (ref 12.0–46.0)
Lymphs Abs: 1.5 10*3/uL (ref 0.7–4.0)
MCHC: 33.4 g/dL (ref 30.0–36.0)
MCV: 84.7 fl (ref 78.0–100.0)
Monocytes Absolute: 0.5 10*3/uL (ref 0.1–1.0)
Monocytes Relative: 8.6 % (ref 3.0–12.0)
Neutro Abs: 3.5 10*3/uL (ref 1.4–7.7)
Neutrophils Relative %: 61.3 % (ref 43.0–77.0)
Platelets: 244 10*3/uL (ref 150.0–400.0)
RBC: 4.63 Mil/uL (ref 3.87–5.11)
RDW: 14 % (ref 11.5–15.5)
WBC: 5.8 10*3/uL (ref 4.0–10.5)

## 2020-10-04 LAB — LIPID PANEL
Cholesterol: 170 mg/dL (ref 0–200)
HDL: 48.8 mg/dL (ref >39.00)
LDL Cholesterol: 99 mg/dL (ref 0–99)
NonHDL: 121.5
Total CHOL/HDL Ratio: 3
Triglycerides: 113 mg/dL (ref 0.0–149.0)
VLDL: 22.6 mg/dL (ref 0.0–40.0)

## 2020-10-04 LAB — TSH: TSH: 3.04 u[IU]/mL (ref 0.35–5.50)

## 2020-10-04 LAB — HEMOGLOBIN A1C: Hgb A1c MFr Bld: 5.2 % (ref 4.6–6.5)

## 2020-10-04 MED ORDER — CEFADROXIL 500 MG PO CAPS: 500.0000 mg | ORAL_CAPSULE | Freq: Two times a day (BID) | ORAL | 0 refills | Status: AC

## 2020-10-04 MED ORDER — TRIAMCINOLONE ACETONIDE 0.1 % EX CREA: 1.0000 "application " | TOPICAL_CREAM | Freq: Two times a day (BID) | CUTANEOUS | 0 refills | Status: AC

## 2020-10-04 NOTE — Patient Instructions (Signed)
Mantenimiento de Technical sales engineer en [place] Maintenance, Female Adoptar un estilo de vida saludable y recibir atencin preventiva son importantes para promover la salud y Musician. Consulte al mdico sobre: El esquema adecuado para hacerse pruebas y exmenes peridicos. Cosas que puede hacer por su cuenta para prevenir enfermedades y [employer]. Qu debo saber sobre la dieta, el peso y el ejercicio? Consuma una dieta saludable  Consuma una dieta que incluya muchas verduras, frutas, productos lcteos con bajo contenido de [place] y Advertising account planner. No consuma muchos alimentos ricos en grasas slidas, azcares agregados o sodio.  Mantenga un peso saludable El ndice de masa muscular [facility]) se [place] para identificar problemas de [place]. Proporciona una estimacin de la grasa corporal basndose en el peso y la altura. Su mdico puede ayudarle a Radiation protection practitioner [place] y a Scientist, forensic o Theatre manager unpeso saludable. Haga ejercicio con regularidad Haga ejercicio con regularidad. Esta es una de las prcticas ms importantes que puede hacer por su salud. La mayora de los adultos deben seguir estas pautas: Optometrist, al menos, 170minutos de actividad fsica por semana. El ejercicio debe aumentar la frecuencia cardaca y Nature conservation officer transpirar (ejercicio de intensidad moderada). Hacer ejercicios de fortalecimiento por lo [employer] por semana. Agregue esto a su plan de ejercicio de intensidad moderada. Pasar menos tiempo sentados. Incluso la actividad fsica ligera puede ser beneficiosa. Controle sus niveles de colesterol y lpidos en la sangre Comience a realizarse anlisis de lpidos y colesterol en la sangre a los20aos y luego reptalos cada 5aos. Hgase controlar los niveles de colesterol con mayor frecuencia si: Sus niveles de lpidos y colesterol son altos. Es mayor de 40aos. Presenta un alto riesgo de padecer enfermedades cardacas. Qu debo saber sobre las pruebas de deteccin del  cncer? Segn su historia clnica y sus antecedentes familiares, es posible que deba realizarse pruebas de deteccin del cncer en diferentes edades. Esto puede incluir pruebas de deteccin de lo siguiente: Cncer de mama. Cncer de cuello uterino. Cncer colorrectal. Cncer de piel. Cncer de pulmn. Qu debo saber sobre la enfermedad cardaca, la diabetes y la hipertensinarterial? Presin arterial y enfermedad cardaca La hipertensin arterial causa enfermedades cardacas y [place] el riesgo de accidente cerebrovascular. Es ms probable que esto se manifieste en las personas que tienen lecturas de presin arterial alta, tienen ascendencia africana o tienen sobrepeso. Hgase controlar la presin arterial: Cada 3 a 5 aos si tiene entre 18 y 80 aos. Todos los aos si es mayor de [place]. Diabetes Realcese exmenes de deteccin de la diabetes con regularidad. Este anlisis revisa el nivel de azcar en la sangre en [place]. Hgase las pruebas de deteccin: Cada tresaos despus de los 28aos de edad si tiene un peso normal y un bajo riesgo de padecer diabetes. Con ms frecuencia y a partir de [place] edad inferior si tiene sobrepeso o un alto riesgo de padecer diabetes. Qu debo saber sobre la prevencin de infecciones? Hepatitis B Si tiene un riesgo ms alto de contraer hepatitis B, debe someterse a un examen de deteccin de este virus. Hable con el mdico para averiguar si tiene riesgode contraer la infeccin por hepatitis B. Hepatitis C Se recomienda el anlisis a: [employer] 1945 y 1965. Todas las personas que tengan un riesgo de haber contrado hepatitis C. Enfermedades de transmisin sexual (ETS) Hgase las pruebas de Programme researcher, broadcasting/film/video de ITS, incluidas la gonorrea y la clamidia, si: Es sexualmente activa y es menor de [place]. Es mayor de 24aos, y Probation officer  le informa que corre riesgo de tener este tipo de infecciones. La actividad sexual ha cambiado desde que le  hicieron la ltima prueba de deteccin y tiene un riesgo mayor de Best boy clamidia o Radio broadcast assistant. Pregntele al mdico si usted tiene riesgo. Pregntele al mdico si usted tiene un alto riesgo de Museum/gallery curator VIH. El mdico tambin puede recomendarle un medicamento recetado para ayudar a evitar la infeccin por el VIH. Si elige tomar medicamentos para prevenir el VIH, primero debe [employer] de deteccin del VIH. Luego debe hacerse anlisis cada 72meses mientras est tomando los medicamentos. Embarazo Si est por dejar de Librarian, academic (fase premenopusica) y usted puede quedar [place], busque asesoramiento antes de [place]. Tome de 400 a [id number] (mcg) de cido [employer] si [place]. Pida mtodos de control de la natalidad (anticonceptivos) si desea evitar un embarazo no deseado. Osteoporosis y [place] La osteoporosis es una enfermedad en la que los huesos pierden los minerales y la fuerza por el avance de la edad. El resultado pueden ser fracturas en los [place]. Si tiene 65aos o ms, o si est en riesgo de sufrir osteoporosis y fracturas, pregunte a su mdico si debe: Hacerse pruebas de deteccin de prdida sea. Tomar un suplemento de calcio o de vitamina D para reducir el riesgo de fracturas. Recibir terapia de reemplazo hormonal (TRH) para tratar los sntomas de la menopausia. Siga estas instrucciones en su casa: Estilo de vida No consuma ningn producto que contenga nicotina o tabaco, como cigarrillos, cigarrillos electrnicos y tabaco de Higher education careers adviser. Si necesita ayuda para dejar de fumar, consulte al mdico. No consuma drogas. No comparta agujas. Solicite ayuda a su mdico si necesita apoyo o informacin para abandonar las drogas. Consumo de alcohol No beba alcohol si: Su mdico le indica no hacerlo. Est embarazada, puede estar embarazada o est tratando de [place]. Si bebe alcohol: Limite la cantidad que consume de 0 a 1 medida por  da. Limite la ingesta si est amamantando. Est atento a la cantidad de alcohol que hay en las bebidas que toma. En los [place], una medida equivale a una botella de cerveza de 12oz (353ml), un vaso de vino de 5oz (145ml) o un vaso de una bebida alcohlica de alta graduacin de 1oz (89ml). Instrucciones generales Realcese los estudios de rutina de la salud, dentales y de Public librarian. [place]. Infrmele a su mdico si: Se siente deprimida con frecuencia. Alguna vez ha sido vctima de [place] o no se siente segura en su casa. Resumen Adoptar un estilo de vida saludable y recibir atencin preventiva son importantes para promover la salud y Musician. Siga las instrucciones del mdico acerca de una dieta saludable, el ejercicio y la realizacin de pruebas o exmenes para Engineer, building services. Siga las instrucciones del mdico con respecto al control del colesterol y la presin arterial. Esta informacin no tiene Marine scientist el consejo del mdico. Asegresede hacerle al mdico cualquier pregunta que tenga. Document Revised: 04/14/2018 Document Reviewed: 04/14/2018 Elsevier Patient Education  [place].

## 2020-10-04 NOTE — Progress Notes (Signed)
Tara Caldwell 45 y.o.   Chief Complaint  Patient presents with   Annual Exam    HISTORY OF PRESENT ILLNESS: This is a 45 y.o. female here for annual exam. Has history of lupus on Plaquenil.  Stable without complications.  Has not seen rheumatologist in over a year. Recent GYN evaluation last month.  No concerns. Today complaining of multiple skin lesions compatible with poison ivy. No other complaints or medical concerns today.  HPI   Prior to Admission medications   Medication Sig Start Date End Date Taking? Authorizing Provider  hydroxychloroquine (PLAQUENIL) 200 MG tablet Take by mouth daily.   Yes [provider]  norelgestromin-ethinyl estradiol (ORTHO EVRA) 150-35 MCG/24HR transdermal patch Place 1 patch onto the skin once a week. 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 No pertinent surgical history.  Social History   Socioeconomic History   Marital status: Married    Spouse name: Not on file   Number of children: Not on file   Years of education: Not on file   Highest education level: Not on file  Occupational History   Not on file  Tobacco Use   Smoking status: Never   Smokeless tobacco: Never  Vaping Use   Vaping Use: Never used  Substance and Sexual  Activity   Alcohol use: No   Drug use: No   Sexual activity: Yes    Birth control/protection: None    Comment: 1st intercourse 22 yo-5 partners  Other Topics Concern   Not on file  Social History Narrative   Not on file   Social Determinants of Health   Financial Resource Strain: Not on file  Food Insecurity: Not on file  Transportation Needs: Not on file  Physical Activity: Not on file  Stress: Not on file  Social Connections: Not on file  Intimate Partner Violence: Not on file    Family History  Problem Relation Age of Onset   Lupus Mother    Breast cancer Mother    Heart disease Father    Heart attack Father    Thyroid disease Sister    Heart disease Maternal Grandmother      Review of Systems  Constitutional: Negative.  Negative for chills and fever.  HENT: Negative.  Negative for congestion and sore throat.   Respiratory: Negative.  Negative for cough and shortness of breath.   Cardiovascular:  Negative for chest pain and palpitations.  Gastrointestinal:  Negative for abdominal pain, diarrhea, nausea and vomiting.  Genitourinary: Negative.  Negative for dysuria and hematuria.  Skin:  Positive for itching and rash.  Neurological:  Negative for dizziness and headaches.  All other systems reviewed and are negative.  Today's Vitals   10/04/20 0810  BP: 100/60  Pulse: 66  Temp: 98.8  F (37.1 C)  TempSrc: Oral  SpO2: 99%  Weight: 167 lb 12.8 oz (76.1 kg)  Height: 5\' 2"  (1.575 m)   Body mass index is 30.69 kg/m. Wt Readings from Last 3 Encounters:  10/04/20 167 lb 12.8 oz (76.1 kg)  05/11/20 172 lb 12.8 oz (78.4 kg)  12/30/19 170 lb 3.2 oz (77.2 kg)    Physical Exam Vitals reviewed.  Constitutional:      Appearance: Normal appearance.  HENT:     Head: Normocephalic.     Right Ear: Tympanic membrane, ear canal and external ear normal.     Left Ear: Tympanic membrane, ear canal and external ear normal.     Mouth/Throat:     Mouth: Mucous membranes  are moist.     Pharynx: Oropharynx is clear.  Eyes:     Extraocular Movements: Extraocular movements intact.     Conjunctiva/sclera: Conjunctivae normal.     Pupils: Pupils are equal, round, and reactive to light.  Cardiovascular:     Rate and Rhythm: Normal rate and regular rhythm.     Pulses: Normal pulses.     Heart sounds: Normal heart sounds.  Pulmonary:     Effort: Pulmonary effort is normal.     Breath sounds: Normal breath sounds.  Abdominal:     General: Bowel sounds are normal. There is no distension.     Palpations: Abdomen is soft.     Tenderness: There is no abdominal tenderness.  Musculoskeletal:     Cervical back: Normal range of motion and neck supple.  Skin:    General: Skin is warm and dry.     Capillary Refill: Capillary refill takes less than 2 seconds.     Findings: Rash present.  Neurological:     General: No focal deficit present.     Mental Status: She is alert and oriented to person, place, and time.  Psychiatric:        Mood and Affect: Mood normal.        Behavior: Behavior normal.      ASSESSMENT & PLAN: Kylen was seen today for annual exam.  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 -     cefadroxil (DURICEF) 500 MG capsule; Take 1 capsule (500 mg  total) by mouth 2 (two) times daily for 7 days.  Modifiable risk factors discussed with patient. Anticipatory guidance according to age provided. The following topics were discussed: Smoking Diet and nutrition Benefits of exercise Cancer screening and possible colonoscopy and or mammogram Vaccinations and recommendations Cardiovascular risk assessment Mental health including depression and anxiety Fall and accident prevention Evaluation of skin rash compatible with poison ivy and secondary bacterial infection.  Needs treatment with topical corticosteroid and oral antibiotic.  Patient Instructions  Mantenimiento de Technical sales engineer en [place] Maintenance, Female Adoptar un estilo de vida saludable y recibir atencin preventiva son importantes para promover la salud y Musician. Consulte al mdico sobre: El esquema adecuado para hacerse pruebas y exmenes peridicos. Cosas que puede hacer por su cuenta para prevenir enfermedades y [employer]. Qu debo saber sobre la dieta, el peso y el ejercicio? Consuma una dieta saludable  Consuma una dieta que incluya muchas verduras, frutas, productos lcteos con bajo contenido de [place] y Advertising account planner. No consuma muchos alimentos ricos en grasas slidas, azcares agregados o sodio.  Mantenga un peso saludable El ndice de masa muscular [facility]) se [place] para identificar problemas de [place]. Proporciona una estimacin de la grasa corporal basndose en el peso y la altura. Su mdico puede ayudarle a Radiation protection practitioner [place] y a Scientist, forensic o Theatre manager unpeso saludable. Haga ejercicio con regularidad Haga ejercicio con regularidad. Esta es una de las prcticas ms importantes que puede hacer por su salud. La [employer] de los adultos deben seguir estas pautas: Optometrist, al menos, 150 minutos de actividad fsica por semana. El ejercicio debe aumentar la frecuencia cardaca y Nature conservation officer transpirar (ejercicio de intensidad moderada). Hacer ejercicios de  fortalecimiento por lo [employer] por semana. Agregue esto a su plan de ejercicio de intensidad moderada. Pasar menos tiempo sentados. Incluso la actividad fsica ligera puede ser beneficiosa. Controle sus niveles de colesterol y lpidos en la sangre Comience a realizarse anlisis de lpidos y colesterol en la sangre a los20 aos y luego reptalos cada 5 aos. Hgase controlar los niveles de colesterol con mayor frecuencia si: Sus niveles de lpidos y colesterol son altos. Es mayor de 56 aos. Presenta un alto riesgo de padecer enfermedades cardacas. Qu debo saber sobre las pruebas de deteccin del cncer? Segn su historia clnica y sus antecedentes familiares, es posible que deba realizarse pruebas de deteccin del cncer en diferentes edades. Esto puede incluir pruebas de deteccin de lo siguiente: Cncer de mama. Cncer de cuello uterino. Cncer colorrectal. Cncer de piel. Cncer de pulmn. Qu debo saber sobre la enfermedad cardaca, la diabetes y la hipertensinarterial? Presin arterial y enfermedad cardaca La hipertensin arterial causa enfermedades cardacas y [place] el riesgo de accidente cerebrovascular. Es ms probable que esto se manifieste en las personas que tienen lecturas de presin arterial alta, tienen ascendencia africana o tienen sobrepeso. Hgase controlar la presin arterial: Cada 3 a 5 aos si tiene entre 18 y 67 aos. Todos los aos si es mayor de 40 aos. Diabetes Realcese exmenes de deteccin de la diabetes con regularidad. Este anlisis revisa el nivel de azcar en la sangre en [place]. Hgase las pruebas de deteccin: Cada tres aos despus de los 25 aos de edad si tiene un peso normal y un bajo riesgo de padecer diabetes. Con ms frecuencia y a partir de [place] edad inferior si tiene sobrepeso o un alto riesgo de padecer diabetes. Qu debo saber sobre la prevencin de infecciones? Hepatitis B Si tiene un riesgo ms alto de contraer hepatitis B,  debe someterse a un examen de deteccin de este virus. Hable con el mdico para averiguar si tiene riesgode contraer la infeccin por hepatitis B. Hepatitis C Se recomienda el anlisis a: [employer] 1945 y 1965. Todas las personas que tengan un riesgo de haber contrado hepatitis C. Enfermedades de transmisin sexual (ETS) Hgase las pruebas de Programme researcher, broadcasting/film/video de ITS, incluidas la gonorrea y la clamidia, si: Es sexualmente activa y es Garment/textile technologist de  24 aos. Es mayor de 34 aos, y Investment banker, operational informa que corre riesgo de tener este tipo de infecciones. La actividad sexual ha cambiado desde que le hicieron la ltima prueba de deteccin y tiene un riesgo mayor de Best boy clamidia o Radio broadcast assistant. Pregntele al mdico si usted tiene riesgo. Pregntele al mdico si usted tiene un alto riesgo de Museum/gallery curator VIH. El mdico tambin puede recomendarle un medicamento recetado para ayudar a evitar la infeccin por el VIH. Si elige tomar medicamentos para prevenir el VIH, primero debe [employer] de deteccin del VIH. Luego debe hacerse anlisis cada 3 meses mientras est tomando los medicamentos. Embarazo Si est por dejar de Librarian, academic (fase premenopusica) y usted puede quedar [place], busque asesoramiento antes de [place]. Tome de 400 a 800 microgramos (mcg) de cido [employer] si [place]. Pida mtodos de control de la natalidad (anticonceptivos) si desea evitar un embarazo no deseado. Osteoporosis y [place] La osteoporosis es una enfermedad en la que los huesos pierden los minerales y la fuerza por el avance de la edad. El resultado pueden ser fracturas en los [place]. Si tiene 67 aos o ms, o si est en riesgo de sufrir osteoporosis y fracturas, pregunte a su mdico si debe: Hacerse pruebas de deteccin de prdida sea. Tomar un suplemento de calcio o de vitamina D para reducir el riesgo de fracturas. Recibir terapia de reemplazo hormonal (TRH) para tratar los  sntomas de la menopausia. Siga estas instrucciones en su casa: Estilo de vida No consuma ningn producto que contenga nicotina o tabaco, como cigarrillos, cigarrillos electrnicos y tabaco de Higher education careers adviser. Si necesita ayuda para dejar de fumar, consulte al mdico. No consuma drogas. No comparta agujas. Solicite ayuda a su mdico si necesita apoyo o informacin para abandonar las drogas. Consumo de alcohol No beba alcohol si: Su mdico le indica no hacerlo. Est embarazada, puede estar embarazada o est tratando de [place]. Si bebe alcohol: Limite la cantidad que consume de 0 a 1 medida por da. Limite la ingesta si est amamantando. Est atento a la cantidad de alcohol que hay en las bebidas que toma. En los [place], una medida equivale a una botella de cerveza de 12 oz (355 ml), un vaso de vino de 5 oz (148 ml) o un vaso de una bebida alcohlica de alta graduacin de 1 oz (44 ml). Instrucciones generales Realcese los estudios de rutina de la salud, dentales y de Public librarian. [place]. Infrmele a su mdico si: Se siente deprimida con frecuencia. Alguna vez ha sido vctima de [place] o no se siente segura en su casa. Resumen Adoptar un estilo de vida saludable y recibir atencin preventiva son importantes para promover la salud y Musician. Siga las instrucciones del mdico acerca de una dieta saludable, el ejercicio y la realizacin de pruebas o exmenes para Engineer, building services. Siga las instrucciones del mdico con respecto al control del colesterol y la presin arterial. Esta informacin no tiene Marine scientist el consejo del mdico. Asegresede hacerle al mdico cualquier pregunta que tenga. Document Revised: 04/14/2018 Document Reviewed: 04/14/2018 Elsevier Patient Education  2022 [place], MD [facility]

## 2020-10-09 ENCOUNTER — Other Ambulatory Visit: Payer: Self-pay | Admitting: Emergency Medicine

## 2020-10-09 ENCOUNTER — Telehealth: Payer: Self-pay

## 2020-10-09 DIAGNOSIS — L237 Allergic contact dermatitis due to plants, except food: Secondary | ICD-10-CM

## 2020-10-09 MED ORDER — METHYLPREDNISOLONE 4 MG PO TBPK: ORAL_TABLET | ORAL | 1 refills | Status: AC

## 2020-10-09 NOTE — Telephone Encounter (Signed)
Call patient please.  New prescription for Medrol dose pack sent to pharmacy of record.  This will help the poison ivy.  Thanks.

## 2020-10-09 NOTE — Telephone Encounter (Signed)
Called husband, Brand Males to give lab results, he states that pt still is having some burning and itching symptoms like when she was in office 10/04/20 for poison ivy. Pt would like to know is there something else she can try.

## 2020-10-10 NOTE — Telephone Encounter (Signed)
Called and spoke with pt husband about provider's recommendations. He verbalized understanding.

## 2020-11-06 ENCOUNTER — Encounter: Payer: 59 | Admitting: Nurse Practitioner

## 2020-11-06 DIAGNOSIS — Z0289 Encounter for other administrative examinations: Secondary | ICD-10-CM

## 2020-11-06 NOTE — Progress Notes (Deleted)
   Tara Caldwell 1975-07-20 [id number]   History:  45 y.o. [id number] presents for annual exam. Nexplanon removed 09/19/2020 and started on Ortho Evra patch at that time. She was experiencing irregular, prolonged bleeding and weight gain with Nexplanon. Normal pap and mammogram history. History of Lupus.   Gynecologic History No LMP recorded. Patient has had an implant.   Contraception/Family planning: Ortho-Evra patches weekly Sexually active: Yes  Health Maintenance Last Pap: 05/16/2019. Results were: Normal Last mammogram: 03/29/2019. Results were: Normal Last colonoscopy: Never Last Dexa: Not indicated  Past medical history, past surgical history, family history and social history were all reviewed and documented in the EPIC chart.  ROS:  A ROS was performed and pertinent positives and negatives are included.  Exam:  There were no vitals filed for this visit. There is no height or weight on file to calculate BMI.  General appearance:  Normal Thyroid:  Symmetrical, normal in size, without palpable masses or nodularity. Respiratory  Auscultation:  Clear without wheezing or rhonchi Cardiovascular  Auscultation:  Regular rate, without rubs, murmurs or gallops  Edema/varicosities:  Not grossly evident Abdominal  Soft,nontender, without masses, guarding or rebound.  Liver/spleen:  No organomegaly noted  Hernia:  None appreciated  Skin  Inspection:  Grossly normal Breasts: Examined lying and sitting.   Right: Without masses, retractions, nipple discharge or axillary adenopathy.   Left: Without masses, retractions, nipple discharge or axillary adenopathy. Genitourinary   Inguinal/mons:  Normal without inguinal adenopathy  External genitalia:  Normal appearing vulva with no masses, tenderness, or lesions  BUS/Urethra/Skene's glands:  Normal  Vagina:  Normal appearing with normal color and discharge, no lesions  Cervix:  Normal appearing without discharge or  lesions  Uterus:  Normal in size, shape and contour.  Midline and mobile, nontender  Adnexa/parametria:     Rt: Normal in size, without masses or tenderness.   Lt: Normal in size, without masses or tenderness.  Anus and perineum: Normal  Digital rectal exam: Normal sphincter tone without palpated masses or tenderness  Patient informed chaperone available to be present for breast and pelvic exam. Patient has requested no chaperone to be present. Patient has been advised what will be completed during breast and pelvic exam.   Assessment/Plan:  45 y.o. [id number] for annual exam.    Return in 1 year for annual.   Tamela Gammon DNP, 10:42 AM 11/06/2020

## 2020-11-13 ENCOUNTER — Telehealth: Payer: Self-pay | Admitting: Dermatology

## 2020-11-13 NOTE — Telephone Encounter (Signed)
Patient's husband is calling for an his wife (patient does not speak [place]) for a referral appointment from Lahoma Rocker, M.D.  Patient does not want to wait until February 2023 for an appointment so would like referral sent back to Dr. Kathlene November.

## 2020-11-15 NOTE — Telephone Encounter (Signed)
Incoming referral has already been closed.

## 2021-01-09 ENCOUNTER — Encounter: Payer: Self-pay | Admitting: Emergency Medicine

## 2021-01-09 ENCOUNTER — Ambulatory Visit: Payer: 59 | Admitting: Emergency Medicine

## 2021-01-14 ENCOUNTER — Encounter: Payer: Self-pay | Admitting: Emergency Medicine

## 2021-01-14 ENCOUNTER — Ambulatory Visit: Payer: 59 | Admitting: Emergency Medicine

## 2021-01-14 ENCOUNTER — Other Ambulatory Visit: Payer: Self-pay

## 2021-01-14 VITALS — BP 116/68 | HR 76 | Temp 97.6°F | Ht 62.0 in | Wt 166.0 lb

## 2021-01-14 DIAGNOSIS — M62838 Other muscle spasm: Secondary | ICD-10-CM

## 2021-01-14 DIAGNOSIS — S29012S Strain of muscle and tendon of back wall of thorax, sequela: Secondary | ICD-10-CM

## 2021-01-14 DIAGNOSIS — M549 Dorsalgia, unspecified: Secondary | ICD-10-CM | POA: Diagnosis not present

## 2021-01-14 DIAGNOSIS — M542 Cervicalgia: Secondary | ICD-10-CM | POA: Diagnosis not present

## 2021-01-14 DIAGNOSIS — S139XXS Sprain of joints and ligaments of unspecified parts of neck, sequela: Secondary | ICD-10-CM

## 2021-01-14 MED ORDER — CYCLOBENZAPRINE HCL 10 MG PO TABS
10.0000 mg | ORAL_TABLET | Freq: Every day | ORAL | 0 refills | Status: AC
Start: 2021-01-14 — End: ?

## 2021-01-14 NOTE — Patient Instructions (Signed)
Esguince cervical Cervical Sprain Al esguince cervical tambin se le denomina "esguince de cuello". Es una distensin o un desgarro en uno o ms ligamentos del cuello. Los ligamentos son tejidos que conectan los huesos. Los esguinces de cuello pueden ser leves, graves o muy graves. Un esguince muy grave en el cuello puede hacer que los huesos del cuello se vuelvan inestables. Esto puede daar la mdula espinal. Tambin puede causar problemas graves en el cerebro, la mdula espinal y los nervios (sistema nervioso). La mayora de los esguinces de cuello se curan en 4 a 6 semanas. Pueden tardar ms o menos tiempo dependiendo de lo siguiente: Qu caus la lesin. La gravedad de la lesin. Cules son las causas? Los esguinces de cuello pueden deberse a un traumatismo, por ejemplo: Una lesin por un accidente en un vehculo, como un automvil o una embarcacin. Una cada. Un movimiento sbito de la cabeza y el cuello de adelante hacia atrs o de un lado a otro (lesin del latigazo cervical). Los esguinces de cuello leves pueden deberse al desgaste que se produce con el paso del tiempo. Qu incrementa el riesgo? Los siguientes factores pueden hacer que sea ms propenso a contraer esta afeccin: Participar en actividades que lo ponen en alto riesgo de lastimarse el cuello. Esto incluye lo siguiente: Deportes de contacto. Carreras de automviles. Hacer gimnasia. Buceo. Asumir riesgos al conducir o montar en un vehculo, como un automvil o una embarcacin. Artritis causada por el desgaste de las articulaciones de la columna vertebral. El cuello no es muy fuerte ni flexible. Haber tenido una lesin en el cuello en el pasado. Mala postura. Pasar mucho tiempo en ciertas posiciones que sobrecargan el cuello. Esto puede ser por estar sentado delante de una computadora durante mucho tiempo. Cules son los signos o sntomas? Los sntomas de esta afeccin incluyen: El cuello, los hombros o la parte  superior de la espalda se sienten: Adoloridos o con molestias. Rgidos. Sensibles. Hinchados. Calientes o con sensacin de quemazn. Rigidez repentina de los msculos del cuello (espasmos). Imposibilidad de mover mucho el cuello. Dolor de cabeza. Sensacin de mareo. Tener ganas de vomitar, o vomitar. Tener una mano o un brazo que: Se siente dbil. Pierde la sensibilidad (est adormecido). Hormigueos. Podr tener sntomas inmediatamente despus de la lesin o estos podrn aparecer con el paso de algunos das. En algunos casos, los sntomas pueden desaparecer con tratamiento y regresar ms adelante. Cmo se trata? Esta afeccin se trata de la siguiente manera: Mantener el cuello en reposo. Aplicar hielo en la zona del cuello que est lastimada. Hacer ejercicios para recuperar el movimiento y la fuerza del cuello (fisioterapia). Si no hay hinchazn, usted puede usar terapia con calor de 2 a 3 das despus de ocurrida la lesin. Si la lesin es muy grave, el tratamiento tambin puede incluir: Mantener el cuello en la misma posicin durante un tiempo. Esto se puede hacer mediante lo siguiente: Un collarn. Este sostiene el mentn y la parte posterior del cuello. Un dispositivo de traccin cervical. Se trata de un cabestrillo que sostiene el cuello. El cabestrillo elimina el peso y la presin del cuello. Tambin puede ayudar a aliviar el dolor. Medicamentos para lo siguiente: Dolor. Irritacin e hinchazn(inflamacin). Medicamentos que ayudan relajar los msculos (relajantes musculares). Ciruga. Esto es poco frecuente. Siga estas instrucciones en su casa: Medicamentos  Use los medicamentos de venta libre y los recetados solamente como se lo haya indicado el mdico. Consulte a su mdico si el medicamento que le recetaron: Hace   necesario que evite conducir o usar maquinaria pesada. Puede causarle dificultad para defecar (estreimiento). Es posible que deba tomar medidas para prevenir o  tratar los problemas para defecar: Electronics engineer suficiente lquido para Contractor pis (la orina) de color amarillo plido. Usar medicamentos recetados o de Radio broadcast assistant. [place] alimentos ricos en fibra. Entre ellos, frijoles, cereales integrales y frutas y verduras frescas. Limitar los alimentos con alto contenido de grasa y Location manager. Estos incluyen alimentos fritos o dulces. Si tiene un collarn para el cuello: selos como se lo haya indicado el mdico. No se lo quite a menos que se lo indiquen. Consulte con el mdico antes de ajustarlo. Si tiene [employer], mantngalo fuera del collarn. Pregntele al mdico si puede quitarse el collarn para la limpieza y el bao. Si puede quitarse el collarn: Siga las instrucciones sobre cmo quitrselo de [place] segura. Lvelo a mano con agua y [place]. Deje que se seque al aire por completo. Si el collarn tiene almohadillas que se pueden retirar: Saque las almohadillas cada 1 o 2 das. Lvelas a mano con agua y [place]. Djelas secar con el aire por completo antes de volver a ponerlas en el collarn. Informe al mdico si tiene irritacin o llagas en la piel debajo del collarn. Control del dolor, la rigidez y la hinchazn   Si el mdico se lo indica, use un dispositivo de traccin cervical. Si se lo indican, aplique hielo en la zona afectada. Para hacer esto: Ponga el hielo en una bolsa plstica. Coloque una toalla entre la piel y Therapist, nutritional. Aplique el hielo durante 20 minutos, 2 a 3 veces por da. Si se lo indican, aplique calor en la zona afectada. Haga esto antes de Engineer, site, o con la frecuencia que le haya indicado el mdico. Use la fuente de calor que el mdico le recomiende, como una compresa de calor hmedo o una almohadilla trmica. Coloque una toalla entre la piel y la fuente de Freight forwarder. Aplique calor durante 20 a 30 minutos. Retire la fuente de calor si la piel se le pone de color rojo brillante. Esto es muy importante si no puede sentir  el dolor, el calor o el fro. Puede correr un riesgo mayor de sufrir quemaduras. Actividad No conduzca vehculos mientras [place] el collar. Si no tiene un collarn para el cuello, pregunte si es seguro que conduzca durante el proceso de curacin del cuello. No levante ningn objeto que pese ms de 10 libras (4.5 kg) o el lmite de peso que le indiquen hasta que el mdico le diga que puede [place]. Haga reposo como se lo haya indicado el mdico. Haga ejercicios como se lo hayan indicado el mdico o el fisioterapeuta. Retome sus actividades normales segn lo indicado por el mdico. [place] posiciones y las actividades que lo hagan Occupational psychologist. Pregntele al mdico qu actividades son seguras para usted. Instrucciones generales No consuma ningn producto que contenga nicotina o tabaco, como cigarrillos, cigarrillos electrnicos y tabaco de Higher education careers adviser. Estos pueden retrasar la recuperacin. Si necesita ayuda para dejar de fumar, consulte al mdico. Concurra a todas las visitas de seguimiento como se lo haya indicado el mdico o el fisioterapeuta. Esto es importante. Cmo se previene? Para evitar que se produzca nuevamente un esguince de cuello: Adopte una buena postura. Modifique su estacin de trabajo de modo que contribuya a esto. Haga ejercicios de forma regular como se lo haya indicado el mdico o el fisioterapeuta. Evite realizar actividades riesgosas o que podran causar un esguince  de cuello. Comunquese con un mdico si: Sus sntomas empeoran. Sus sntomas no mejoran despus de 2 semanas de tratamiento. El dolor [place]. Los medicamentos no Forensic psychologist. Tiene sntomas nuevos que no puede explicar. El Control and instrumentation engineer en la piel o le causa molestias en la piel. Solicite ayuda de inmediato si: Interior and spatial designer. Le aparece alguno de los siguientes en cualquier parte del cuerpo: Prdida de la sensibilidad. Hormigueo. Debilidad. No puede mover una parte del cuerpo. Tiene  dolor en el cuello y alguno de estos sntomas: Mareo muy intenso. Dolor de cabeza muy intenso. Resumen Al esguince cervical tambin se le denomina "esguince de cuello". Es Ardelia Mems distensin o un desgarro en uno o ms ligamentos del cuello. Los ligamentos son tejidos que conectan a los [place]. Los esguinces de cuello pueden deberse a un traumatismo, tal como una lesin o una cada. Podr tener sntomas inmediatamente despus de la lesin o estos podrn aparecer con el paso de [employer]. Los esguinces de cuello pueden tratarse con reposo, calor, hielo, medicamentos, ejercicios y [place]. Esta informacin no tiene Marine scientist el consejo del mdico. Asegrese de hacerle al mdico cualquier pregunta que tenga. Document Revised: 03/02/2019 Document Reviewed: 03/02/2019 Elsevier Patient Education  2022 [employer].
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
## 2021-01-24 ENCOUNTER — Encounter: Payer: Self-pay | Admitting: Surgery

## 2021-01-24 ENCOUNTER — Other Ambulatory Visit: Payer: Self-pay

## 2021-01-24 ENCOUNTER — Ambulatory Visit (INDEPENDENT_AMBULATORY_CARE_PROVIDER_SITE_OTHER): Payer: 59 | Admitting: Surgery

## 2021-01-24 ENCOUNTER — Ambulatory Visit: Payer: Self-pay

## 2021-01-24 VITALS — BP 105/69 | HR 66 | Ht 62.0 in | Wt 166.0 lb

## 2021-01-24 DIAGNOSIS — M545 Low back pain, unspecified: Secondary | ICD-10-CM

## 2021-01-24 DIAGNOSIS — M542 Cervicalgia: Secondary | ICD-10-CM | POA: Diagnosis not present
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
## 2021-01-31 ENCOUNTER — Telehealth: Payer: Self-pay | Admitting: Orthopaedic Surgery

## 2021-01-31 NOTE — Telephone Encounter (Signed)
Pts husband pedro called on her behalf since she speaks spanish; and stated a MRI referral was supposed to be sent but no one has called them yet. The pt would like to have this checked on and then would like a CB.  [phone]

## 2021-02-04 ENCOUNTER — Other Ambulatory Visit: Payer: Self-pay | Admitting: Surgery

## 2021-02-04 DIAGNOSIS — M545 Low back pain, unspecified: Secondary | ICD-10-CM

## 2021-02-04 DIAGNOSIS — M542 Cervicalgia: Secondary | ICD-10-CM

## 2021-02-04 NOTE — Telephone Encounter (Signed)
Order was not placed in workque to do the MRI lumbar and cervical spine, order has now been placed and will place as urgent so can get pt in ASAP.

## 2021-02-05 NOTE — Telephone Encounter (Signed)
Pendng review with the cervical spine with insurance, Lumbar was approved

## 2021-02-05 NOTE — Telephone Encounter (Signed)
Someone call pt yesterday with [employer] and had to leave message

## 2021-02-13 ENCOUNTER — Encounter: Payer: Self-pay | Admitting: *Deleted

## 2021-02-13 NOTE — Telephone Encounter (Signed)
Faxed appeal today for review on csp

## 2021-02-15 ENCOUNTER — Ambulatory Visit: Payer: 59 | Admitting: Orthopaedic Surgery

## 2021-02-20 NOTE — Telephone Encounter (Signed)
Pt came in office with husband and we discussed why we are waiting on approval. I explained to them I had to send appeal on 11/09 and it could take up to 15-30 days for a decision. I advised since the lumbar spine was approved if wanted can go have the MRI on lsp and wait on csp to come back or can pay out of pocket and have the insurance to reimburse. Pt husband stated they will wait to see if approved for CSP and get them both at one time for cost effective.

## 2021-02-27 NOTE — Telephone Encounter (Signed)
Pt has been approved with insurance for CSP

## 2021-03-05 ENCOUNTER — Ambulatory Visit
Admission: RE | Admit: 2021-03-05 | Discharge: 2021-03-05 | Disposition: A | Discharge status: Home / Self Care | Payer: 59 | Source: Ambulatory Visit | Attending: Surgery | Admitting: Surgery

## 2021-03-05 DIAGNOSIS — M545 Low back pain, unspecified: Secondary | ICD-10-CM

## 2021-03-05 DIAGNOSIS — M542 Cervicalgia: Secondary | ICD-10-CM
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
## 2021-03-08 ENCOUNTER — Ambulatory Visit: Payer: 59 | Admitting: Orthopaedic Surgery

## 2021-03-08 ENCOUNTER — Other Ambulatory Visit: Payer: Self-pay

## 2021-03-08 ENCOUNTER — Encounter: Payer: Self-pay | Admitting: Orthopaedic Surgery

## 2021-03-08 VITALS — BP 102/67 | Ht 62.0 in | Wt 166.0 lb

## 2021-03-08 DIAGNOSIS — M545 Low back pain, unspecified: Secondary | ICD-10-CM | POA: Diagnosis not present

## 2021-03-08 DIAGNOSIS — M47812 Spondylosis without myelopathy or radiculopathy, cervical region: Secondary | ICD-10-CM | POA: Diagnosis not present

## 2021-03-08 DIAGNOSIS — M542 Cervicalgia: Secondary | ICD-10-CM | POA: Diagnosis not present

## 2021-03-08 MED ORDER — DICLOFENAC SODIUM 75 MG PO TBEC: 75.0000 mg | DELAYED_RELEASE_TABLET | Freq: Two times a day (BID) | ORAL | 1 refills | Status: AC
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
## 2021-03-11 DIAGNOSIS — M47812 Spondylosis without myelopathy or radiculopathy, cervical region: Secondary | ICD-10-CM | POA: Insufficient documentation

## 2021-03-26 ENCOUNTER — Ambulatory Visit: Payer: 59 | Admitting: Physical Therapy

## 2021-04-05 ENCOUNTER — Ambulatory Visit: Payer: 59 | Admitting: Orthopaedic Surgery

## 2021-04-26 ENCOUNTER — Ambulatory Visit: Payer: 59 | Admitting: Orthopaedic Surgery

## 2021-05-23 ENCOUNTER — Encounter: Payer: Self-pay | Admitting: Surgery

## 2021-05-23 ENCOUNTER — Ambulatory Visit: Payer: 59 | Admitting: Surgery

## 2021-05-23 ENCOUNTER — Other Ambulatory Visit: Payer: Self-pay

## 2021-05-23 VITALS — BP 110/72 | HR 71 | Ht 62.0 in | Wt 166.0 lb

## 2021-05-23 DIAGNOSIS — M545 Low back pain, unspecified: Secondary | ICD-10-CM

## 2021-05-23 DIAGNOSIS — M542 Cervicalgia: Secondary | ICD-10-CM

## 2021-06-03 ENCOUNTER — Encounter: Payer: Self-pay | Admitting: Physical Medicine and Rehabilitation

## 2021-06-03 ENCOUNTER — Ambulatory Visit: Payer: 59 | Admitting: Physical Medicine and Rehabilitation

## 2021-06-03 ENCOUNTER — Other Ambulatory Visit: Payer: Self-pay

## 2021-06-03 VITALS — BP 117/77 | HR 71

## 2021-06-03 DIAGNOSIS — M546 Pain in thoracic spine: Secondary | ICD-10-CM

## 2021-06-03 DIAGNOSIS — M79605 Pain in left leg: Secondary | ICD-10-CM

## 2021-06-03 DIAGNOSIS — M542 Cervicalgia: Secondary | ICD-10-CM

## 2021-06-03 DIAGNOSIS — G8929 Other chronic pain: Secondary | ICD-10-CM

## 2021-06-03 DIAGNOSIS — M7918 Myalgia, other site: Secondary | ICD-10-CM
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
## 2021-06-03 NOTE — Progress Notes (Signed)
Pt here today to review Mri of the Neck and lower back.

## 2021-06-05 ENCOUNTER — Other Ambulatory Visit: Payer: Self-pay

## 2021-06-05 ENCOUNTER — Encounter: Payer: Self-pay | Admitting: Physical Therapy

## 2021-06-05 ENCOUNTER — Ambulatory Visit (INDEPENDENT_AMBULATORY_CARE_PROVIDER_SITE_OTHER): Payer: 59 | Admitting: Physical Therapy

## 2021-06-05 DIAGNOSIS — M542 Cervicalgia: Secondary | ICD-10-CM | POA: Diagnosis not present

## 2021-06-05 DIAGNOSIS — M5412 Radiculopathy, cervical region: Secondary | ICD-10-CM

## 2021-06-05 DIAGNOSIS — M6281 Muscle weakness (generalized): Secondary | ICD-10-CM
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
## 2021-06-19 ENCOUNTER — Other Ambulatory Visit: Payer: Self-pay

## 2021-06-19 ENCOUNTER — Encounter: Payer: Self-pay | Admitting: Rehabilitative and Restorative Service Providers"

## 2021-06-19 ENCOUNTER — Ambulatory Visit (INDEPENDENT_AMBULATORY_CARE_PROVIDER_SITE_OTHER): Payer: 59 | Admitting: Rehabilitative and Restorative Service Providers"

## 2021-06-19 DIAGNOSIS — M542 Cervicalgia: Secondary | ICD-10-CM

## 2021-06-19 DIAGNOSIS — M6281 Muscle weakness (generalized): Secondary | ICD-10-CM | POA: Diagnosis not present

## 2021-06-19 DIAGNOSIS — M5412 Radiculopathy, cervical region: Secondary | ICD-10-CM | POA: Diagnosis not present

## 2021-06-19 NOTE — Therapy (Addendum)
?OUTPATIENT PHYSICAL THERAPY TREATMENT NOTE ? ? ?Patient Name: Tara Caldwell ?MRN: [id number] ?DOB:07/27/1975, 46 y.o., female ?Today's Date: 06/19/2021 ? ?PCP: Horald Pollen, MD ?REFERRING PROVIDER: Lorine Bears, NP ? ? PT End of Session - 06/19/21 1340   ? ? Visit Number 2   ? Number of Visits 12   ? Date for PT Re-Evaluation 07/17/21   ? PT Start Time 7673   ? PT Stop Time 1418   ? PT Time Calculation (min) 35 min   ? Activity Tolerance Patient tolerated treatment well   ? Behavior During Therapy [facility] for tasks assessed/performed   ? ?  ?  ? ?  ? ? ?Past Medical History:  ?Diagnosis Date  ? Asthma   ? Lupus ([place])   ? ?History reviewed. No pertinent surgical history. ?Patient Active Problem List  ? Diagnosis Date Noted  ? Spondylosis without myelopathy or radiculopathy, cervical region 03/11/2021  ? 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 History of systemic lupus erythematosus ([place]) 10/04/2020  ? Primary osteoarthritis of both knees 07/17/2017  ? Primary osteoarthritis of both hands 07/17/2017  ? Chronic midline low back pain without sciatica 07/17/2017  ? Positive ANA (antinuclear antibody) 07/17/2017  ? History of thyroid disorder 04/21/2017  ? Family history of systemic lupus erythematosus (SLE) in mother 04/21/2017  ? ? ?REFERRING PROVIDER: Lorine Bears, NP ?  ?REFERRING DIAG: M54.2 (ICD-10-CM) - Cervicalgia ?M79.18 (ICD-10-CM) - Myofascial pain syndrome ? ?ONSET DATE: July, 2022 ? ?THERAPY DIAG:  ?Cervicalgia ? ?Radiculopathy, cervical region ? ?Muscle weakness (generalized) ? ?PERTINENT HISTORY: Small central disc protrusions at C3-4 and C4-5, Primary osteoarthritis of both knees, Primary osteoarthritis of both hands, Chronic midline low back pain without sciatica,  History of systemic lupus erythematosus , Spondylosis without myelopathy or radiculopathy, cervical region ? ?PRECAUTIONS: None ? ?SUBJECTIVE: Pt  stated she felt fantastic with last visit.  Pt stated about 5 days of improvement then symptoms returned.  Pt indicated the symptoms stayed a bit better overall.  Pt stated she did exercise at home.  ? ?Communication through interpreter.  ? ?PAIN:  ?Are you having pain? Yes ?NPRS scale: 6/10 ?Pain location: Neck ?Pain orientation: Bilateral (more on the Lt.)  ?PAIN TYPE: sharp and tight ?Pain description: intermittent ?Aggravating factors: neck activity in general  ?Relieving factors: past PT has helped with pain (only temporarily), heat ? ? ? ? ? ?OBJECTIVE:  ?  ?DIAGNOSTIC FINDINGS:  ?06/05/2021: "cervical MRI from 2022 exhibits multi-level mild facet arthropathy and mild disc bulging with small central disc protrusions at C3-4 and C4-5 without significant stenosis or cord deformity. No high grade spinal canal stenosis noted. " ?  ?PATIENT SURVEYS:  ?06/05/2021: FOTO: 36% ?  ?  ?COGNITION: ?06/05/2021:Overall cognitive status: Within functional limits for tasks assessed ?           ?SENSATION: ?06/05/2021:Light touch: Appears intact ?Hot/Cold: Appears intact ?  ?  ?POSTURE:  ?06/05/2021:Forward head posture noted.  ?  ?PALPATION: ?06/19/2021: Trigger points and tenderness in bilateral upper traps ? ?06/05/2021:Tenderness to palpation at bilat upper traps, peri- spinal muscles of cervical and thoracic regions. Cervical/ Thoracic PAM's noted concordant pain as well.  ?  ?  ?CERVICAL AROM/PROM ?  ?A/PROM AROM (deg) ?06/05/2021  ?Flexion Full AROM w/ concordant pain   ?Extension Full AROM w/ concordant pain  ?Right lateral flexion Full AROM w/ concordant pain  ?Left lateral flexion Full AROM w/ concordant pain  ?  Right rotation Full AROM w/ concordant pain  ?Left rotation 90% of full AROM w/ concordant pain  ? (Blank rows = not tested) ?  ?UE AROM/PROM: All shoulder AROM bilat grossly WNL  ?  ?UE MMT: ?  ?MMT Right ?06/05/2021 Left ?06/05/2021  ?Shoulder flexion 4+ 4  ?Shoulder abduction 4+ 4  ?Shoulder internal rotation 4+ 4  ?Shoulder  external rotation 4+ 4+  ?Neck flexion 14.9# 14.9#  ?Neck extension 15.4# 15.4#  ?Neck Rt sidebend 11.8#    ?Neck Left sidebend   9.8#  ? (Blank rows = not tested) ?  ?CERVICAL SPECIAL TESTS:  ?06/05/2021:Spurling's test: Positive for tightness, negative for radicular sx  and Distraction test: Positive ?  ?  ?  ?TODAY'S TREATMENT:  ?06/19/2021: ? Therex: Additional time spent in review of HEP (verbal, visual and tactile cues required) ?Seated cervical retraction x 10 ?   Seated scapular retraction x 10, 3 sec hold ?   Seated upper trap 15 sec x 5 bilateral (additional cues required) ?   Blue band rows 20x bilateral ?   Blue band gh ext 20 x bilateral ? Modalities: Pre-mod  to bilateral upper trap to tolerance 10 mins c moist heat, performed in prone. ? ? ? ?06/05/2021:           ?E-stim applied to bilar upper traps and thoracic peri- spinals. (IFC x  8 minutes, current to pt tolerance) with heat to bilar shoulders/ neck. ?  ?  ?PATIENT EDUCATION:  ?06/19/2021: ?Education details: HEP review and cues ?Person educated: Patient ?Education method: Handouts ?Education comprehension: needs further education ?  ?  ?HOME EXERCISE PROGRAM: ?06/19/2021: ?Access Code: [email or web address] ?URL: [email or web address] ?Date: 06/19/2021 ?Prepared by: Scot Jun ? ?Exercises ?Seated Passive Cervical Retraction - 2 x daily - 6 x weekly - 2 sets - 10 reps ?Mid-Lower Cervical Extension SNAG with Strap - 2 x daily - 6 x weekly - 2 sets - 10 reps ?Seated Upper Trapezius Stretch - 2 x daily - 6 x weekly - 1 sets - 2-3 reps - 15-30 sec hold ?Seated Thoracic Lumbar Extension - 2 x daily - 6 x weekly - 2 sets - 10 reps ?Sidelying Thoracic Lumbar Rotation - 2 x daily - 6 x weekly - 2 sets - 10 reps - 5 hold ?Seated Scapular Retraction - 3-5 x daily - 7 x weekly - 1 sets - 10 reps - 3-5 hold ? ?  ?  ?  ?ASSESSMENT: ?  ?CLINICAL IMPRESSION:  Presentation of continued complaints noted in bilateral upper trap c myofascial restrictions noted and  tenderness in areas.  Per Pt request and positive report from last visit, estim applied to area today.  Review of HEP c cues required to match techniques of printed HEP on evaluation.  Continued skilled PT services indicated at this time.  ?  ?OBJECTIVE IMPAIRMENTS: decreased activity tolerance, decreased endurance, decreased mobility, decreased ROM, decreased strength, impaired flexibility, impaired UE/LE use, postural dysfunction, and pain. ?  ?ACTIVITY LIMITATIONS: bending, lifting, carry, locomotion, cleaning, community activity, driving, and or occupation ?  ?PERSONAL FACTORS: Small central disc protrusions at C3-4 and C4-5, Primary osteoarthritis of both knees, Primary osteoarthritis of both hands, Chronic midline low back pain without sciatica,  History of systemic lupus erythematosus , Spondylosis without myelopathy or radiculopathy, cervical region ?  ?  ?REHAB POTENTIAL: Good ?  ?CLINICAL DECISION MAKING: stable/uncomplicated ?  ?EVALUATION COMPLEXITY: Low ?  ?  ?GOALS: ?Eval 06/05/2021: ?Short term PT Goals (target date for  Short term goals are 4 weeks 07/06/21) ?Pt will be I and compliant with HEP. ? ?Goal status: on going - assessed 06/19/2021 ?Pt will decrease pain by 25% overall ? ?Goal status: on going - assessed 06/19/2021 ?  ?Long term PT goals (target dates for all long term goals are 6 weeks 07/17/21) ?Pt will improve  shoulder strength to at least 4+/5 MMT to improve functional strength ? ?Goal status: New ?Pt will improve FOTO to at least 49% functional to show improved function ? ?Goal status: New ?Pt will reduce pain to overall less than 2-3/10 with usual activity and work activity. ? ?Goal status: New ?Report less pain and difficulty with driving and turning her head. ? ?Goal status: New ?  ?PLAN: ?PT FREQUENCY: 2 times per week  ?  ?PT DURATION: 6 weeks ?  ?PLANNED INTERVENTIONS (unless contraindicated): aquatic PT, cryotherapy, Electrical stimulation, Iontophoresis with 4 mg/ml dexamethasome,  Moist heat, traction, Ultrasound, Therapeutic exercise, neuromuscular re-education, patient/family education, manual techniques, passive ROM, dry needling, taping, vasopnuematic device, spinal manipulations

## 2021-06-21 ENCOUNTER — Ambulatory Visit: Payer: 59 | Admitting: Rehabilitative and Restorative Service Providers"

## 2021-06-21 ENCOUNTER — Other Ambulatory Visit: Payer: Self-pay

## 2021-06-21 ENCOUNTER — Encounter: Payer: Self-pay | Admitting: Rehabilitative and Restorative Service Providers"

## 2021-06-21 DIAGNOSIS — M542 Cervicalgia: Secondary | ICD-10-CM

## 2021-06-21 DIAGNOSIS — M5412 Radiculopathy, cervical region: Secondary | ICD-10-CM

## 2021-06-21 DIAGNOSIS — M6281 Muscle weakness (generalized): Secondary | ICD-10-CM | POA: Diagnosis not present

## 2021-06-21 NOTE — Therapy (Signed)
?OUTPATIENT PHYSICAL THERAPY TREATMENT NOTE ? ? ?Patient Name: Tara Caldwell ?MRN: [id number] ?DOB:[birth year], 46 y.o., female ?Today's Date: 06/21/2021 ? ?PCP: Horald Pollen, MD ?REFERRING PROVIDER: Lorine Bears, NP ? ? PT End of Session - 06/21/21 1257   ? ? Visit Number 3   ? Number of Visits 12   ? Date for PT Re-Evaluation 07/17/21   ? PT Start Time 1259   ? PT Stop Time 4401   ? PT Time Calculation (min) 39 min   ? Activity Tolerance Patient tolerated treatment well   ? Behavior During Therapy [facility] for tasks assessed/performed   ? ?  ?  ? ?  ? ? ? ?Past Medical History:  ?Diagnosis Date  ? Asthma   ? Lupus ([place])   ? ?History reviewed. No pertinent surgical history. ?Patient Active Problem List  ? Diagnosis Date Noted  ? Spondylosis without myelopathy or radiculopathy, cervical region 03/11/2021  ? 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 History of systemic lupus erythematosus ([place]) 10/04/2020  ? Primary osteoarthritis of both knees 07/17/2017  ? Primary osteoarthritis of both hands 07/17/2017  ? Chronic midline low back pain without sciatica 07/17/2017  ? Positive ANA (antinuclear antibody) 07/17/2017  ? History of thyroid disorder 04/21/2017  ? Family history of systemic lupus erythematosus (SLE) in mother 04/21/2017  ? ? ?REFERRING PROVIDER: Lorine Bears, NP ?  ?REFERRING DIAG: M54.2 (ICD-10-CM) - Cervicalgia ?M79.18 (ICD-10-CM) - Myofascial pain syndrome ? ?ONSET DATE: July, 2022 ? ?THERAPY DIAG:  ?Cervicalgia ? ?Radiculopathy, cervical region ? ?Muscle weakness (generalized) ? ?PERTINENT HISTORY: Small central disc protrusions at C3-4 and C4-5, Primary osteoarthritis of both knees, Primary osteoarthritis of both hands, Chronic midline low back pain without sciatica,  History of systemic lupus erythematosus , Spondylosis without myelopathy or radiculopathy, cervical region ? ?PRECAUTIONS: None ? ?SUBJECTIVE: Pt  indicated pain strong today, 7/10 from neck and upper back across shoulders and mid back.  Pt stated she was better for a day but then pain returned.  Pt reported anterior/lateral rib pain on Lt in last 2 days but did not have any specific causation reported.  ? ?Communication through interpreter.  ? ?PAIN:  ?Are you having pain? Yes ?NPRS scale: 7/10 ?Pain location: Neck ?Pain orientation: Bilateral ?PAIN TYPE: burning ?Pain description: intermittent ?Aggravating factors: insidious worsening  ?Relieving factors: Estim/heat ? ? ? ? ? ?OBJECTIVE:  ?  ?DIAGNOSTIC FINDINGS:  ?06/05/2021: "cervical MRI from 2022 exhibits multi-level mild facet arthropathy and mild disc bulging with small central disc protrusions at C3-4 and C4-5 without significant stenosis or cord deformity. No high grade spinal canal stenosis noted. " ?  ?PATIENT SURVEYS:  ?06/05/2021: FOTO: 36% ?  ?  ?COGNITION: ?06/05/2021:Overall cognitive status: Within functional limits for tasks assessed ?           ?SENSATION: ?06/05/2021:Light touch: Appears intact ?Hot/Cold: Appears intact ?  ?  ?POSTURE:  ?06/05/2021:Forward head posture noted.  ?  ?PALPATION: ?06/19/2021: Trigger points and tenderness in bilateral upper traps ? ?06/05/2021:Tenderness to palpation at bilat upper traps, peri- spinal muscles of cervical and thoracic regions. Cervical/ Thoracic PAM's noted concordant pain as well.  ?  ?  ?CERVICAL AROM/PROM ?  ?A/PROM AROM (deg) ?06/05/2021  ?Flexion Full AROM w/ concordant pain   ?Extension Full AROM w/ concordant pain  ?Right lateral flexion Full AROM w/ concordant pain  ?Left lateral flexion Full AROM w/ concordant pain  ?Right rotation Full AROM  w/ concordant pain  ?Left rotation 90% of full AROM w/ concordant pain  ? (Blank rows = not tested) ?  ?UE AROM/PROM: All shoulder AROM bilat grossly WNL  ?  ?UE MMT: ?  ?MMT Right ?06/05/2021 Left ?06/05/2021  ?Shoulder flexion 4+ 4  ?Shoulder abduction 4+ 4  ?Shoulder internal rotation 4+ 4  ?Shoulder external  rotation 4+ 4+  ?Neck flexion 14.9# 14.9#  ?Neck extension 15.4# 15.4#  ?Neck Rt sidebend 11.8#    ?Neck Left sidebend   9.8#  ? (Blank rows = not tested) ?  ?CERVICAL SPECIAL TESTS:  ?06/05/2021:Spurling's test: Positive for tightness, negative for radicular sx  and Distraction test: Positive ?  ?  ?  ?TODAY'S TREATMENT:  ?06/21/2021: ? Therex:  ?Supine upper cervical flexion hold 5 sec x 10 ?Supine horizontal abduction bilateral shoulder green band 2 x 10  ?Seated cervical retraction x 10 ?   Seated scapular retraction x 10, 3 sec hold ?   Seated upper trap 15 sec x 5 bilateral (additional cues required) ?   Green  band rows 20x bilateral ?   Green  band gh ext 20 x bilateral ?   UBE fwd/rev 2 mins each way lvl 3.0 ? ? Manual: Compression to upper trap c shrug movement, performed bilateral. Percussive device to bilateral upper trap, infraspinatus bilateral, rhomboids/middle trap, scalenes bilateral for soft tissue mobilization.  ?  ? ?06/19/2021: ? Therex: Additional time spent in review of HEP (verbal, visual and tactile cues required) ?Seated cervical retraction x 10 ?   Seated scapular retraction x 10, 3 sec hold ?   Seated upper trap 15 sec x 5 bilateral (additional cues required) ?   Blue band rows 20x bilateral ?   Blue band gh ext 20 x bilateral ? Modalities: Pre-mod  to bilateral upper trap to tolerance 10 mins c moist heat, performed in prone. ? ?06/05/2021:           ?E-stim applied to bilar upper traps and thoracic peri- spinals. (IFC x  8 minutes, current to pt tolerance) with heat to bilar shoulders/ neck. ?  ?  ?PATIENT EDUCATION:  ?06/19/2021: ?Education details: HEP review and cues ?Person educated: Patient ?Education method: Handouts ?Education comprehension: needs further education ?  ?  ?HOME EXERCISE PROGRAM: ?06/21/2021: ?Access Code: [email or web address] ?URL: [email or web address] ?Date: 06/21/2021 ?Prepared by: Scot Jun ? ?Exercises ?Seated Passive Cervical Retraction - 2-3 x daily - 6 x  weekly - 2 sets - 10 reps ?Seated Upper Trapezius Stretch - 2-3 x daily - 6 x weekly - 1 sets - 2-3 reps - 15-30 sec hold ?Seated Thoracic Lumbar Extension - 2 x daily - 6 x weekly - 2 sets - 10 reps ?Sidelying Thoracic Lumbar Rotation - 2 x daily - 6 x weekly - 2 sets - 10 reps - 5 hold ?Seated Scapular Retraction - 3-5 x daily - 7 x weekly - 1 sets - 10 reps - 3-5 hold ?Supine Shoulder Horizontal Abduction with Resistance - 2 x daily - 7 x weekly - 1-2 sets - 10 reps ?Standing Shoulder Row with Anchored Resistance - 2 x daily - 7 x weekly - 1-2 sets - 10 reps ? ? ?  ?  ?  ?ASSESSMENT: ?  ?CLINICAL IMPRESSION:  Concordant cervical and headache complaint symptoms noted from palpation to bilateral upper trap.  Treated in manual c compression/movement to perform myofascial release.  Fair tolerance overall but improvement in symptoms noted post manual activity.  Continued progression in  posterior chain strengthening for postural support and emphasis on HEP routine consistently.  ?  ?OBJECTIVE IMPAIRMENTS: decreased activity tolerance, decreased endurance, decreased mobility, decreased ROM, decreased strength, impaired flexibility, impaired UE/LE use, postural dysfunction, and pain. ?  ?ACTIVITY LIMITATIONS: bending, lifting, carry, locomotion, cleaning, community activity, driving, and or occupation ?  ?PERSONAL FACTORS: Small central disc protrusions at C3-4 and C4-5, Primary osteoarthritis of both knees, Primary osteoarthritis of both hands, Chronic midline low back pain without sciatica,  History of systemic lupus erythematosus , Spondylosis without myelopathy or radiculopathy, cervical region ?  ?  ?REHAB POTENTIAL: Good ?  ?CLINICAL DECISION MAKING: stable/uncomplicated ?  ?EVALUATION COMPLEXITY: Low ?  ?  ?GOALS: ?Eval 06/05/2021: ?Short term PT Goals (target date for Short term goals are 4 weeks 07/06/21) ?Pt will be I and compliant with HEP. ? ?Goal status: on going - assessed 06/19/2021 ?Pt will decrease pain by  25% overall ? ?Goal status: on going - assessed 06/19/2021 ?  ?Long term PT goals (target dates for all long term goals are 6 weeks 07/17/21) ?Pt will improve  shoulder strength to at least 4+/5 MMT to improve fu

## 2021-06-24 ENCOUNTER — Encounter: Payer: Self-pay | Admitting: Physical Therapy

## 2021-06-24 ENCOUNTER — Ambulatory Visit (INDEPENDENT_AMBULATORY_CARE_PROVIDER_SITE_OTHER): Payer: 59 | Admitting: Physical Therapy

## 2021-06-24 ENCOUNTER — Other Ambulatory Visit: Payer: Self-pay

## 2021-06-24 DIAGNOSIS — M542 Cervicalgia: Secondary | ICD-10-CM | POA: Diagnosis not present

## 2021-06-24 DIAGNOSIS — M5412 Radiculopathy, cervical region: Secondary | ICD-10-CM | POA: Diagnosis not present

## 2021-06-24 DIAGNOSIS — M6281 Muscle weakness (generalized): Secondary | ICD-10-CM

## 2021-06-24 NOTE — Therapy (Signed)
?OUTPATIENT PHYSICAL THERAPY TREATMENT NOTE ? ? ?Patient Name: Tara Caldwell ?MRN: [id number] ?DOB:03/24/1976, 46 y.o., female ?Today's Date: 06/24/2021 ? ?PCP: Horald Pollen, MD ?REFERRING PROVIDER: [place], [place] - 06/24/21 1534   ? ? Visit Number 4   ? Number of Visits 12   ? Date for PT Re-Evaluation 07/17/21   ? PT Start Time 1515   ? PT Stop Time 9562   ? PT Time Calculation (min) 38 min   ? Activity Tolerance Patient tolerated treatment well   ? Behavior During Therapy [facility] for tasks assessed/performed   ? ?  ?  ? ?  ? ? ? ?Past Medical History:  ?Diagnosis Date  ? Asthma   ? Lupus ([place])   ? ?History reviewed. No pertinent surgical history. ?Patient Active Problem List  ? Diagnosis Date Noted  ? Spondylosis without myelopathy or radiculopathy, cervical region 03/11/2021  ? 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 History of systemic lupus erythematosus ([place]) 10/04/2020  ? Primary osteoarthritis of both knees 07/17/2017  ? Primary osteoarthritis of both hands 07/17/2017  ? Chronic midline low back pain without sciatica 07/17/2017  ? Positive ANA (antinuclear antibody) 07/17/2017  ? History of thyroid disorder 04/21/2017  ? Family history of systemic lupus erythematosus (SLE) in mother 04/21/2017  ? ? ?REFERRING PROVIDER: Lorine Bears, NP ?  ?REFERRING DIAG: M54.2 (ICD-10-CM) - Cervicalgia ?M79.18 (ICD-10-CM) - Myofascial pain syndrome ? ?ONSET DATE: July, 2022 ? ?THERAPY DIAG:  ?Cervicalgia ? ?Radiculopathy, cervical region ? ?Muscle weakness (generalized) ? ?PERTINENT HISTORY: Small central disc protrusions at C3-4 and C4-5, Primary osteoarthritis of both knees, Primary osteoarthritis of both hands, Chronic midline low back pain without sciatica,  History of systemic lupus erythematosus , Spondylosis without myelopathy or radiculopathy, cervical region ? ?PRECAUTIONS: None ? ?SUBJECTIVE: Pt  stated she was better for a about 5 days after last time but now pain is back in her neck when she turns her head. ?Communication through interpreter.  ? ?PAIN:  ?Are you having pain? Yes ?NPRS scale: 6/10 ?Pain location: Neck ?Pain orientation: Bilateral ?PAIN TYPE: burning ?Pain description: intermittent ?Aggravating factors: insidious worsening  ?Relieving factors: Estim/heat ? ? ? ? ? ?OBJECTIVE:  ?  ?DIAGNOSTIC FINDINGS:  ?06/05/2021: "cervical MRI from 2022 exhibits multi-level mild facet arthropathy and mild disc bulging with small central disc protrusions at C3-4 and C4-5 without significant stenosis or cord deformity. No high grade spinal canal stenosis noted. " ?  ?PATIENT SURVEYS:  ?06/05/2021: FOTO: 36% ?  ?  ?COGNITION: ?06/05/2021:Overall cognitive status: Within functional limits for tasks assessed ?           ?SENSATION: ?06/05/2021:Light touch: Appears intact ?Hot/Cold: Appears intact ?  ?  ?POSTURE:  ?06/05/2021:Forward head posture noted.  ?  ?PALPATION: ?06/19/2021: Trigger points and tenderness in bilateral upper traps ? ?06/05/2021:Tenderness to palpation at bilat upper traps, peri- spinal muscles of cervical and thoracic regions. Cervical/ Thoracic PAM's noted concordant pain as well.  ?  ?  ?CERVICAL AROM/PROM ?  ?A/PROM AROM (deg) ?06/05/2021  ?Flexion Full AROM w/ concordant pain   ?Extension Full AROM w/ concordant pain  ?Right lateral flexion Full AROM w/ concordant pain  ?Left lateral flexion Full AROM w/ concordant pain  ?Right rotation Full AROM w/ concordant pain  ?Left rotation 90% of full AROM w/ concordant pain  ? (Blank rows = not tested) ?  ?UE AROM/PROM: All  shoulder AROM bilat grossly WNL  ?  ?UE MMT: ?  ?MMT Right ?06/05/2021 Left ?06/05/2021  ?Shoulder flexion 4+ 4  ?Shoulder abduction 4+ 4  ?Shoulder internal rotation 4+ 4  ?Shoulder external rotation 4+ 4+  ?Neck flexion 14.9# 14.9#  ?Neck extension 15.4# 15.4#  ?Neck Rt sidebend 11.8#    ?Neck Left sidebend   9.8#  ? (Blank rows = not  tested) ?  ?CERVICAL SPECIAL TESTS:  ?06/05/2021:Spurling's test: Positive for tightness, negative for radicular sx  and Distraction test: Positive ?  ?  ?  ?TODAY'S TREATMENT:  ?06/24/2021: ? Therex:  ?Standing horizontal abduction bilateral shoulder green band 2 x 10  ?Standing bilat ER with green 2X10 ?   Green  band rows 20x bilateral ?   Green  band gh ext 20 x bilateral ?   UBE fwd/rev 2 mins each way lvl 3.0 ? ? Manual: Percussive device in use to instead performed STM with tennis ball and also performed ischemic Compression trigger point release to to upper traps, infraspinaus,supraspinatus, and Cervical paraspinals. ?Modalities: E-stim applied to bilar upper traps and thoracic peri- spinals. (IFC x  10 minutes, current to pt tolerance) with heat to bilar shoulders/ neck. ? ?06/21/2021: ? Therex:  ?Supine upper cervical flexion hold 5 sec x 10 ?Supine horizontal abduction bilateral shoulder green band 2 x 10  ?Seated cervical retraction x 10 ?   Seated scapular retraction x 10, 3 sec hold ?   Seated upper trap 15 sec x 5 bilateral (additional cues required) ?   Green  band rows 20x bilateral ?   Green  band gh ext 20 x bilateral ?   UBE fwd/rev 2 mins each way lvl 3.0 ? ? Manual: Compression to upper trap c shrug movement, performed bilateral. Percussive device to bilateral upper trap, infraspinatus bilateral, rhomboids/middle trap, scalenes bilateral for soft tissue mobilization.  ?    ?  ?PATIENT EDUCATION:  ?06/19/2021: ?Education details: HEP review and cues ?Person educated: Patient ?Education method: Handouts ?Education comprehension: needs further education ?  ?  ?HOME EXERCISE PROGRAM: ?06/21/2021: ?Access Code: [email or web address] ?URL: [email or web address] ?Date: 06/21/2021 ?Prepared by: Scot Jun ? ?Exercises ?Seated Passive Cervical Retraction - 2-3 x daily - 6 x weekly - 2 sets - 10 reps ?Seated Upper Trapezius Stretch - 2-3 x daily - 6 x weekly - 1 sets - 2-3 reps - 15-30 sec hold ?Seated  Thoracic Lumbar Extension - 2 x daily - 6 x weekly - 2 sets - 10 reps ?Sidelying Thoracic Lumbar Rotation - 2 x daily - 6 x weekly - 2 sets - 10 reps - 5 hold ?Seated Scapular Retraction - 3-5 x daily - 7 x weekly - 1 sets - 10 reps - 3-5 hold ?Supine Shoulder Horizontal Abduction with Resistance - 2 x daily - 7 x weekly - 1-2 sets - 10 reps ?Standing Shoulder Row with Anchored Resistance - 2 x daily - 7 x weekly - 1-2 sets - 10 reps ? ? ?  ?  ?  ?ASSESSMENT: ?  ?CLINICAL IMPRESSION:  She continues to have higher pain levels but is at least getting some short term relief with PT. We will continue to work to improve her functional impairments and decrease overall pain. ?  ?OBJECTIVE IMPAIRMENTS: decreased activity tolerance, decreased endurance, decreased mobility, decreased ROM, decreased strength, impaired flexibility, impaired UE postural dysfunction, and pain. ?  ?ACTIVITY LIMITATIONS: bending, lifting, carry, locomotion, cleaning, community activity, driving, and or occupation ?  ?PERSONAL FACTORS: Small  central disc protrusions at C3-4 and C4-5, Primary osteoarthritis of both knees, Primary osteoarthritis of both hands, Chronic midline low back pain without sciatica,  History of systemic lupus erythematosus , Spondylosis without myelopathy or radiculopathy, cervical region ?  ?  ?REHAB POTENTIAL: Good ?  ?CLINICAL DECISION MAKING: stable/uncomplicated ?  ?EVALUATION COMPLEXITY: Low ?  ?  ?GOALS: ?Eval 06/05/2021: ?Short term PT Goals (target date for Short term goals are 4 weeks 07/06/21) ?Pt will be I and compliant with HEP. ? ?Goal status: on going - assessed 06/19/2021 ?Pt will decrease pain by 25% overall ? ?Goal status: on going - assessed 06/19/2021 ?  ?Long term PT goals (target dates for all long term goals are 6 weeks 07/17/21) ?Pt will improve  shoulder strength to at least 4+/5 MMT to improve functional strength ? ?Goal status: New ?Pt will improve FOTO to at least 49% functional to show improved  function ? ?Goal status: New ?Pt will reduce pain to overall less than 2-3/10 with usual activity and work activity. ? ?Goal status: New ?Report less pain and difficulty with driving and turning her head. ? ?Goal sta

## 2021-06-27 ENCOUNTER — Encounter: Payer: 59 | Admitting: Rehabilitative and Restorative Service Providers"

## 2021-07-01 ENCOUNTER — Other Ambulatory Visit: Payer: Self-pay

## 2021-07-01 ENCOUNTER — Encounter: Payer: Self-pay | Admitting: Physical Therapy

## 2021-07-01 ENCOUNTER — Ambulatory Visit: Payer: 59 | Admitting: Physical Therapy

## 2021-07-01 DIAGNOSIS — M5412 Radiculopathy, cervical region: Secondary | ICD-10-CM | POA: Diagnosis not present

## 2021-07-01 DIAGNOSIS — M542 Cervicalgia: Secondary | ICD-10-CM | POA: Diagnosis not present

## 2021-07-01 DIAGNOSIS — M6281 Muscle weakness (generalized): Secondary | ICD-10-CM

## 2021-07-01 NOTE — Therapy (Signed)
?OUTPATIENT PHYSICAL THERAPY TREATMENT NOTE ? ? ?Patient Name: Tara Caldwell ?MRN: [id number] ?DOB:Aug 23, 1975, 46 y.o., female ?Today's Date: 07/01/2021 ? ?PCP: Horald Pollen, MD ?REFERRING PROVIDER: Lorine Bears, NP ? ? PT End of Session - 07/01/21 1611   ? ? Visit Number 5   ? Number of Visits 12   ? Date for PT Re-Evaluation 07/17/21   ? PT Start Time 3435   ? PT Stop Time 6861   ? PT Time Calculation (min) 40 min   ? Activity Tolerance Patient tolerated treatment well   ? Behavior During Therapy [facility] for tasks assessed/performed   ? ?  ?  ? ?  ? ? ? ?Past Medical History:  ?Diagnosis Date  ? Asthma   ? Lupus ([place])   ? ?History reviewed. No pertinent surgical history. ?Patient Active Problem List  ? Diagnosis Date Noted  ? Spondylosis without myelopathy or radiculopathy, cervical region 03/11/2021  ? 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 History of systemic lupus erythematosus ([place]) 10/04/2020  ? Primary osteoarthritis of both knees 07/17/2017  ? Primary osteoarthritis of both hands 07/17/2017  ? Chronic midline low back pain without sciatica 07/17/2017  ? Positive ANA (antinuclear antibody) 07/17/2017  ? History of thyroid disorder 04/21/2017  ? Family history of systemic lupus erythematosus (SLE) in mother 04/21/2017  ? ? ?REFERRING PROVIDER: Lorine Bears, NP ?  ?REFERRING DIAG: M54.2 (ICD-10-CM) - Cervicalgia ?M79.18 (ICD-10-CM) - Myofascial pain syndrome ? ?ONSET DATE: July, 2022 ? ?THERAPY DIAG:  ?Cervicalgia ? ?Radiculopathy, cervical region ? ?Muscle weakness (generalized) ? ?PERTINENT HISTORY: Small central disc protrusions at C3-4 and C4-5, Primary osteoarthritis of both knees, Primary osteoarthritis of both hands, Chronic midline low back pain without sciatica,  History of systemic lupus erythematosus , Spondylosis without myelopathy or radiculopathy, cervical region ? ?PRECAUTIONS: None ? ?SUBJECTIVE: Pt  stated she does not have pain today upon arrival but at night pain can get to 8/10 in her neck and shoulders.  ?Communication through interpreter.  ? ?PAIN:  ?Are you having pain? Yes ?NPRS scale: 0 at rest and 8/10 at night ?Pain location: Neck ?Pain orientation: Bilateral ?PAIN TYPE: burning ?Pain description: intermittent ?Aggravating factors: insidious worsening  ?Relieving factors: Estim/heat ? ? ? ? ? ?OBJECTIVE:  ?  ?DIAGNOSTIC FINDINGS:  ?06/05/2021: "cervical MRI from 2022 exhibits multi-level mild facet arthropathy and mild disc bulging with small central disc protrusions at C3-4 and C4-5 without significant stenosis or cord deformity. No high grade spinal canal stenosis noted. " ?  ?PATIENT SURVEYS:  ?06/05/2021: FOTO: 36% ?  ?  ?COGNITION: ?06/05/2021:Overall cognitive status: Within functional limits for tasks assessed ?           ?SENSATION: ?06/05/2021:Light touch: Appears intact ?Hot/Cold: Appears intact ?  ?  ?POSTURE:  ?06/05/2021:Forward head posture noted.  ?  ?PALPATION: ?06/19/2021: Trigger points and tenderness in bilateral upper traps ? ?06/05/2021:Tenderness to palpation at bilat upper traps, peri- spinal muscles of cervical and thoracic regions. Cervical/ Thoracic PAM's noted concordant pain as well.  ?  ?  ?CERVICAL AROM/PROM ?  ?A/PROM AROM (deg) ?06/05/2021  ?Flexion Full AROM w/ concordant pain   ?Extension Full AROM w/ concordant pain  ?Right lateral flexion Full AROM w/ concordant pain  ?Left lateral flexion Full AROM w/ concordant pain  ?Right rotation Full AROM w/ concordant pain  ?Left rotation 90% of full AROM w/ concordant pain  ? (Blank rows = not tested) ?  ?  UE AROM/PROM: All shoulder AROM bilat grossly WNL  ?  ?UE MMT: ?  ?MMT Right ?06/05/2021 Left ?06/05/2021  ?Shoulder flexion 4+ 4  ?Shoulder abduction 4+ 4  ?Shoulder internal rotation 4+ 4  ?Shoulder external rotation 4+ 4+  ?Neck flexion 14.9# 14.9#  ?Neck extension 15.4# 15.4#  ?Neck Rt sidebend 11.8#    ?Neck Left sidebend   9.8#  ?  (Blank rows = not tested) ?  ?CERVICAL SPECIAL TESTS:  ?06/05/2021:Spurling's test: Positive for tightness, negative for radicular sx  and Distraction test: Positive ?  ?  ?  ?TODAY'S TREATMENT:  ?07/01/2021: ?Therex:  ?Standing horizontal abduction bilateral shoulder green band 2 x 10  ?Standing bilat ER with green 2X10 ?Green  band rows 20x bilateral ?UBE fwd/rev 2 mins each way lvl 3.0 ? ?Manual:Percussive device to to upper traps, infraspinaus,supraspinatus, and Cervical paraspinals. ? ?Modalities: Mechanical cervical traction 16-11# intermittent program 20 min total including set up ? ?06/24/2021: ? Therex:  ?Standing horizontal abduction bilateral shoulder green band 2 x 10  ?Standing bilat ER with green 2X10 ?  Green  band rows 20x bilateral ?  UBE fwd/rev 2 mins each way lvl 3.0 ? ? Manual:Percussive device in use to instead performed STM to to upper traps, infraspinaus,supraspinatus, and Cervical paraspinals. ?Modalities: E-stim applied to bilar upper traps and thoracic peri- spinals. (IFC x  10 minutes, current to pt tolerance) with heat to bilar shoulders/ neck. ? ? ?    ?  ?PATIENT EDUCATION:  ?06/19/2021: ?Education details: HEP review and cues ?Person educated: Patient ?Education method: Handouts ?Education comprehension: needs further education ?  ?  ?HOME EXERCISE PROGRAM: ?06/21/2021: ?Access Code: [email or web address] ?URL: [email or web address] ?Date: 06/21/2021 ?Prepared by: Scot Jun ? ?Exercises ?Seated Passive Cervical Retraction - 2-3 x daily - 6 x weekly - 2 sets - 10 reps ?Seated Upper Trapezius Stretch - 2-3 x daily - 6 x weekly - 1 sets - 2-3 reps - 15-30 sec hold ?Seated Thoracic Lumbar Extension - 2 x daily - 6 x weekly - 2 sets - 10 reps ?Sidelying Thoracic Lumbar Rotation - 2 x daily - 6 x weekly - 2 sets - 10 reps - 5 hold ?Seated Scapular Retraction - 3-5 x daily - 7 x weekly - 1 sets - 10 reps - 3-5 hold ?Supine Shoulder Horizontal Abduction with Resistance - 2 x daily - 7 x  weekly - 1-2 sets - 10 reps ?Standing Shoulder Row with Anchored Resistance - 2 x daily - 7 x weekly - 1-2 sets - 10 reps ? ? ?  ?  ?  ?ASSESSMENT: ?  ?CLINICAL IMPRESSION:  She is doing well at rest but is having high pain levels at night with cause unknown. We trialed mechanical lumbar traction today to see if this will help decrease overall pain any.  ?  ?OBJECTIVE IMPAIRMENTS: decreased activity tolerance, decreased endurance, decreased mobility, decreased ROM, decreased strength, impaired flexibility, impaired UE postural dysfunction, and pain. ?  ?ACTIVITY LIMITATIONS: bending, lifting, carry, locomotion, cleaning, community activity, driving, and or occupation ?  ?PERSONAL FACTORS: Small central disc protrusions at C3-4 and C4-5, Primary osteoarthritis of both knees, Primary osteoarthritis of both hands, Chronic midline low back pain without sciatica,  History of systemic lupus erythematosus , Spondylosis without myelopathy or radiculopathy, cervical region ?  ?  ?REHAB POTENTIAL: Good ?  ?CLINICAL DECISION MAKING: stable/uncomplicated ?  ?EVALUATION COMPLEXITY: Low ?  ?  ?GOALS: ?Eval 06/05/2021: ?Short term PT Goals (target date for Short term goals  are 4 weeks 07/06/21) ?Pt will be I and compliant with HEP. ? ?Goal status: on going - assessed 06/19/2021 ?Pt will decrease pain by 25% overall ? ?Goal status: on going - assessed 06/19/2021 ?  ?Long term PT goals (target dates for all long term goals are 6 weeks 07/17/21) ?Pt will improve  shoulder strength to at least 4+/5 MMT to improve functional strength ? ?Goal status: New ?Pt will improve FOTO to at least 49% functional to show improved function ? ?Goal status: New ?Pt will reduce pain to overall less than 2-3/10 with usual activity and work activity. ? ?Goal status: New ?Report less pain and difficulty with driving and turning her head. ? ?Goal status: New ?  ?PLAN: ?PT FREQUENCY: 2 times per week  ?  ?PT DURATION: 6 weeks ?  ?PLANNED INTERVENTIONS (unless  contraindicated): aquatic PT, cryotherapy, Electrical stimulation, Iontophoresis with 4 mg/ml dexamethasome, Moist heat, traction, Ultrasound, Therapeutic exercise, neuromuscular re-education, patient/family educat

## 2021-07-02 ENCOUNTER — Ambulatory Visit: Payer: Self-pay | Admitting: Emergency Medicine

## 2021-07-03 ENCOUNTER — Ambulatory Visit: Payer: 59 | Admitting: Physical Therapy

## 2021-07-03 ENCOUNTER — Encounter: Payer: Self-pay | Admitting: Physical Therapy

## 2021-07-03 DIAGNOSIS — M6281 Muscle weakness (generalized): Secondary | ICD-10-CM | POA: Diagnosis not present

## 2021-07-03 DIAGNOSIS — M5412 Radiculopathy, cervical region: Secondary | ICD-10-CM | POA: Diagnosis not present

## 2021-07-03 DIAGNOSIS — M542 Cervicalgia: Secondary | ICD-10-CM | POA: Diagnosis not present

## 2021-07-03 NOTE — Therapy (Addendum)
OUTPATIENT PHYSICAL THERAPY TREATMENT NOTE/Discharge PHYSICAL THERAPY DISCHARGE SUMMARY  Visits from Start of Care: 6  Current functional level related to goals / functional outcomes: See below   Remaining deficits: See below   Education / Equipment: HEP  Plan: Patient goals were 4/6 met. Patient is being discharged due to not returning to PT  Elsie Ra, PT, DPT 09/11/21 10:47 AM      Patient Name: Tara Caldwell MRN: [id number] DOB:09/09/1975, 46 y.o., female Today's Date: 07/03/2021  PCP: Horald Pollen, MD REFERRING PROVIDER: Lorine Bears, NP   PT End of Session - 07/03/21 1523     Visit Number 6    Number of Visits 12    Date for PT Re-Evaluation 07/17/21    PT Start Time 1601    PT Stop Time 1600    PT Time Calculation (min) 44 min    Activity Tolerance Patient tolerated treatment well    Behavior During Therapy [facility] for tasks assessed/performed              Past Medical History:  Diagnosis Date   Asthma    Lupus ([place])    History reviewed. No pertinent surgical history. 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[place]) 10/04/2020   History of systemic lupus erythematosus ([place]) 10/04/2020   Primary osteoarthritis of both knees 07/17/2017   Primary osteoarthritis of both hands 07/17/2017   Chronic midline low back pain without sciatica 07/17/2017   Positive ANA (antinuclear antibody) 07/17/2017   History of thyroid disorder 04/21/2017   Family history of systemic lupus erythematosus (SLE) in mother 04/21/2017    REFERRING PROVIDER: Lorine Bears, NP   REFERRING DIAG: M54.2 (ICD-10-CM) - Cervicalgia M79.18 (ICD-10-CM) - Myofascial pain syndrome  ONSET DATE: July, 2022  THERAPY DIAG:  Cervicalgia  Radiculopathy, cervical region  Muscle  weakness (generalized)  PERTINENT HISTORY: Small central disc protrusions at C3-4 and C4-5, Primary osteoarthritis of both knees, Primary osteoarthritis of both hands, Chronic midline low back pain without sciatica,  History of systemic lupus erythematosus , Spondylosis without myelopathy or radiculopathy, cervical region  PRECAUTIONS: None  SUBJECTIVE: Pt stated the traction helped last time, she is doing good today and not really having pain. Communication through interpreter.   PAIN:  Are you having pain? Yes NPRS scale: 0 at rest and 8/10 at night Pain location: Neck Pain orientation: Bilateral PAIN TYPE: burning Pain description: intermittent Aggravating factors: insidious worsening  Relieving factors: Estim/heat      OBJECTIVE:    DIAGNOSTIC FINDINGS:  06/05/2021: "cervical MRI from 2022 exhibits multi-level mild facet arthropathy and mild disc bulging with small central disc protrusions at C3-4 and C4-5 without significant stenosis or cord deformity. No high grade spinal canal stenosis noted. "   PATIENT SURVEYS:  06/05/2021: FOTO: 36%     COGNITION: 06/05/2021:Overall cognitive status: Within functional limits for tasks assessed            SENSATION: 06/05/2021:Light touch: Appears intact Hot/Cold: Appears intact     POSTURE:  06/05/2021:Forward head posture noted.    PALPATION: 06/19/2021: Trigger points and tenderness in bilateral upper traps  06/05/2021:Tenderness to palpation at bilat upper traps, peri- spinal muscles of cervical and thoracic regions. Cervical/ Thoracic PAM's noted concordant pain as well.      CERVICAL AROM/PROM   A/PROM AROM (deg) 06/05/2021 AROM  Flexion  Full AROM w/ concordant pain  Full, no pain  Extension Full AROM w/ concordant pain Full but pain  Right lateral flexion Full AROM w/ concordant pain Full but pain  Left lateral flexion Full AROM w/ concordant pain Full but pain  Right rotation Full AROM w/ concordant pain Full no pain  Left  rotation 90% of full AROM w/ concordant pain 90% , no pain   (Blank rows = not tested)   UE AROM/PROM: All shoulder AROM bilat grossly WNL    UE MMT:   MMT Right 06/05/2021 Left 06/05/2021 Right 07/03/21 Left 07/03/21  Shoulder flexion 4+ 4 5 4+  Shoulder abduction 4+ 4 5 4+  Shoulder internal rotation 4+ $RemoveBef'4 5 5  '[id number]$ Shoulder external rotation 4+ 4+ 4+ 4+  Neck flexion 14.9# 14.9#    Neck extension 15.4# 15.4#    Neck Rt sidebend 11.8#      Neck Left sidebend   9.8#     (Blank rows = not tested)   CERVICAL SPECIAL TESTS:  06/05/2021:Spurling's test: Positive for tightness, negative for radicular sx  and Distraction test: Positive       TODAY'S TREATMENT:  07/03/2021: Therex:  Standing horizontal abduction bilateral shoulder green band 2 x 10  Standing bilat ER with green 2X10 Green band rows and extensions 20x bilateral UBE fwd/rev 3 mins each way lvl 3.0  Manual:Percussive device to to upper traps, infraspinaus,supraspinatus, and Cervical paraspinals.  Modalities: Mechanical cervical traction 18-13# intermittent program 20 min total including set up  07/01/2021: Therex:  Standing horizontal abduction bilateral shoulder green band 2 x 10  Standing bilat ER with green 2X10 Green  band rows 20x bilateral UBE fwd/rev 2 mins each way lvl 3.0  Manual:Percussive device to to upper traps, infraspinaus,supraspinatus, and Cervical paraspinals.  Modalities: Mechanical cervical traction 16-11# intermittent program 20 min total including set up  06/24/2021:  Therex:  Standing horizontal abduction bilateral shoulder green band 2 x 10  Standing bilat ER with green 2X10   Green  band rows 20x bilateral   UBE fwd/rev 2 mins each way lvl 3.0   Manual:Percussive device in use to instead performed STM to to upper traps, infraspinaus,supraspinatus, and Cervical paraspinals. Modalities: E-stim applied to bilar upper traps and thoracic peri- spinals. (IFC x  10 minutes, current to pt tolerance)  with heat to bilar shoulders/ neck.         PATIENT EDUCATION:  06/19/2021: Education details: HEP review and cues Person educated: Patient Education method: Handouts Education comprehension: needs further education     HOME EXERCISE PROGRAM: 06/21/2021: Access Code: [email or web address] URL: [email or web address] Date: 06/21/2021 Prepared by: Scot Jun  Exercises Seated Passive Cervical Retraction - 2-3 x daily - 6 x weekly - 2 sets - 10 reps Seated Upper Trapezius Stretch - 2-3 x daily - 6 x weekly - 1 sets - 2-3 reps - 15-30 sec hold Seated Thoracic Lumbar Extension - 2 x daily - 6 x weekly - 2 sets - 10 reps Sidelying Thoracic Lumbar Rotation - 2 x daily - 6 x weekly - 2 sets - 10 reps - 5 hold Seated Scapular Retraction - 3-5 x daily - 7 x weekly - 1 sets - 10 reps - 3-5 hold Supine Shoulder Horizontal Abduction with Resistance - 2 x daily - 7 x weekly - 1-2 sets - 10 reps Standing Shoulder Row with Anchored Resistance - 2 x daily - 7 x weekly - 1-2 sets - 10 reps  ASSESSMENT:   CLINICAL IMPRESSION:  She appears to be doing better with the night pain after last time adding in mechanical cervical traction. We again performed this but added more pull today (she did have some pain/discomfort last minute of this treatment so discontinued early). She did show improvements in overall UE strength and has less neck pain with ROM assessments. She met her FOTO functional score goal and appears to be a reasonably good functional level although does still have some pain with photography and driving. She will see PA tommorow and I will have her discuss with him if she needs to continue PT or not and I will leave it up to her if she wants to make any more appointments.    OBJECTIVE IMPAIRMENTS: decreased activity tolerance, decreased endurance, decreased mobility, decreased ROM, decreased strength, impaired flexibility, impaired UE postural dysfunction, and pain.   ACTIVITY  LIMITATIONS: bending, lifting, carry, locomotion, cleaning, community activity, driving, and or occupation   PERSONAL FACTORS: Small central disc protrusions at C3-4 and C4-5, Primary osteoarthritis of both knees, Primary osteoarthritis of both hands, Chronic midline low back pain without sciatica,  History of systemic lupus erythematosus , Spondylosis without myelopathy or radiculopathy, cervical region     REHAB POTENTIAL: Good   CLINICAL DECISION MAKING: stable/uncomplicated   EVALUATION COMPLEXITY: Low     GOALS: Eval 06/05/2021: Short term PT Goals (target date for Short term goals are 4 weeks 07/06/21) Pt will be I and compliant with HEP.  Goal status: MET Pt will decrease pain by 25% overall  Goal status: MET   Long term PT goals (target dates for all long term goals are 6 weeks 07/17/21) Pt will improve  shoulder strength to at least 4+/5 MMT to improve functional strength  Goal status: MET  Pt will improve FOTO to at least 49% functional to show improved function  Goal status: MET Pt will reduce pain to overall less than 2-3/10 with usual activity and work activity.  Goal status: ongoing, still with some pain with photography Report less pain and difficulty with driving and turning her head.  Goal status: ongoing still with some pain with driving   PLAN: PT FREQUENCY: 2 times per week    PT DURATION: 6 weeks   PLANNED INTERVENTIONS (unless contraindicated): aquatic PT, cryotherapy, Electrical stimulation, Iontophoresis with 4 mg/ml dexamethasome, Moist heat, traction, Ultrasound, Therapeutic exercise, neuromuscular re-education, patient/family education, manual techniques, passive ROM, dry needling, taping, vasopnuematic device, spinal manipulations, joint manipulations   PLAN FOR NEXT SESSION: will have her discuss with MD about DC vs continuing PT.   Elsie Ra, PT, DPT 07/03/21 3:49 PM

## 2021-07-04 ENCOUNTER — Ambulatory Visit (INDEPENDENT_AMBULATORY_CARE_PROVIDER_SITE_OTHER): Payer: 59 | Admitting: Surgery

## 2021-07-04 ENCOUNTER — Encounter: Payer: Self-pay | Admitting: Surgery

## 2021-07-04 DIAGNOSIS — M4722 Other spondylosis with radiculopathy, cervical region: Secondary | ICD-10-CM

## 2021-07-04 DIAGNOSIS — M4726 Other spondylosis with radiculopathy, lumbar region: Secondary | ICD-10-CM | POA: Diagnosis not present
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
## 2021-07-08 ENCOUNTER — Other Ambulatory Visit: Payer: Self-pay | Admitting: Nurse Practitioner

## 2021-07-08 DIAGNOSIS — Z30016 Encounter for initial prescription of transdermal patch hormonal contraceptive device: Secondary | ICD-10-CM

## 2021-07-18 ENCOUNTER — Ambulatory Visit (INDEPENDENT_AMBULATORY_CARE_PROVIDER_SITE_OTHER): Payer: 59 | Admitting: Emergency Medicine

## 2021-07-18 ENCOUNTER — Encounter: Payer: Self-pay | Admitting: Emergency Medicine

## 2021-07-18 VITALS — BP 130/82 | HR 70 | Temp 97.9°F | Ht 62.0 in | Wt 171.0 lb

## 2021-07-18 DIAGNOSIS — Z13228 Encounter for screening for other metabolic disorders: Secondary | ICD-10-CM

## 2021-07-18 DIAGNOSIS — Z114 Encounter for screening for human immunodeficiency virus [HIV]: Secondary | ICD-10-CM | POA: Diagnosis not present

## 2021-07-18 DIAGNOSIS — Z1159 Encounter for screening for other viral diseases: Secondary | ICD-10-CM

## 2021-07-18 DIAGNOSIS — M329 Systemic lupus erythematosus, unspecified: Secondary | ICD-10-CM | POA: Diagnosis not present

## 2021-07-18 DIAGNOSIS — Z1329 Encounter for screening for other suspected endocrine disorder: Secondary | ICD-10-CM

## 2021-07-18 DIAGNOSIS — Z13220 Encounter for screening for lipoid disorders: Secondary | ICD-10-CM | POA: Diagnosis not present

## 2021-07-18 DIAGNOSIS — Z130 Encounter for screening for diseases of the blood and blood-forming organs and certain disorders involving the immune mechanism: Secondary | ICD-10-CM | POA: Diagnosis not present

## 2021-07-18 DIAGNOSIS — Z0000 Encounter for general adult medical examination without abnormal findings: Secondary | ICD-10-CM

## 2021-07-18 DIAGNOSIS — Z1231 Encounter for screening mammogram for malignant neoplasm of breast: Secondary | ICD-10-CM

## 2021-07-18 DIAGNOSIS — Z1211 Encounter for screening for malignant neoplasm of colon: Secondary | ICD-10-CM

## 2021-07-18 NOTE — Patient Instructions (Signed)

## 2021-07-18 NOTE — Progress Notes (Signed)
Tara Caldwell ?46 y.o. ? ? ?Chief Complaint  ?Patient presents with  ? neck and back pain  ? ? ?HISTORY OF PRESENT ILLNESS: ?This is a 46 y.o. female here for annual exam. ?Needs referral to gynecologist ?Needs referral for mammogram and colonoscopy ?History of lupus.  Stable.  Sees rheumatologist on a regular basis. ?Occasional neck and back pain but none today. ?No other complaints or medical concerns today. ? ?HPI ? ? ?Prior to Admission medications   ?Medication Sig Start Date End Date Taking? Authorizing Provider  ?cyclobenzaprine (FLEXERIL) 10 MG tablet Take 1 tablet (10 mg total) by mouth at bedtime. 01/14/21  Yes Mikolaj Woolstenhulme, Ines Bloomer, MD  ?diclofenac (VOLTAREN) 75 MG EC tablet Take 1 tablet (75 mg total) by mouth 2 (two) times daily. 03/08/21  Yes Marybelle Killings, MD  ?hydroxychloroquine (PLAQUENIL) 200 MG tablet Take by mouth daily.   Yes [provider]  ?methylPREDNISolone (MEDROL DOSEPAK) 4 MG TBPK tablet Sig as indicated 10/09/20  Yes Rondall Radigan, Ines Bloomer, MD  ?norelgestromin-ethinyl estradiol (ORTHO EVRA) 150-35 MCG/24HR transdermal patch Place 1 patch onto the skin once a week. 09/19/20  Yes Marny Lowenstein A, NP  ?triamcinolone cream (KENALOG) 0.1 % Apply 1 application topically 2 (two) times daily. 10/04/20  Yes Horald Pollen, MD  ? ? ?Allergies  ?Allergen Reactions  ? Poison Ivy Extract Hives  ? ? ?Patient Active Problem List  ? Diagnosis Date Noted  ? Spondylosis without myelopathy or radiculopathy, cervical region 03/11/2021  ? Other specified disorders involving the immune mechanism, not elsewhere classified ([place]) 10/04/2020  ? History of systemic lupus erythematosus ([place]) 10/04/2020  ? Primary osteoarthritis of both knees 07/17/2017  ? Primary osteoarthritis of both hands 07/17/2017  ? Chronic midline low back pain without sciatica 07/17/2017  ? Positive ANA (antinuclear antibody) 07/17/2017  ? History of thyroid disorder 04/21/2017  ? Family history of systemic  lupus erythematosus (SLE) in mother 04/21/2017  ? ? ?Past Medical History:  ?Diagnosis Date  ? Asthma   ? Lupus ([place])   ? ? ?No past surgical history on file. ? ?Social History  ? ?Socioeconomic History  ? Marital status: Married  ?  Spouse name: Not on file  ? Number of children: Not on file  ? Years of education: Not on file  ? Highest education level: Not on file  ?Occupational History  ? Not on file  ?Tobacco Use  ? Smoking status: Never  ? Smokeless tobacco: Never  ?Vaping Use  ? Vaping Use: Never used  ?Substance and Sexual Activity  ? Alcohol use: No  ? Drug use: No  ? Sexual activity: Yes  ?  Birth control/protection: None  ?  Comment: 1st intercourse 85 yo-5 partners  ?Other Topics Concern  ? Not on file  ?Social History Narrative  ? Not on file  ? ?Social Determinants of Health  ? ?Financial Resource Strain: Not on file  ?Food Insecurity: Not on file  ?Transportation Needs: Not on file  ?Physical Activity: Not on file  ?Stress: Not on file  ?Social Connections: Not on file  ?Intimate Partner Violence: Not on file  ? ? ?Family History  ?Problem Relation Age of Onset  ? Lupus Mother   ? Breast cancer Mother   ? Heart disease Father   ? Heart attack Father   ? Thyroid disease Sister   ? Heart disease Maternal Grandmother   ? ? ? ?Review of Systems  ?Constitutional: Negative.  Negative for chills and fever.  ?  HENT: Negative.    ?Cardiovascular: Negative.  Negative for chest pain and palpitations.  ?Gastrointestinal: Negative.   ?Genitourinary: Negative.   ?Musculoskeletal:  Positive for back pain and joint pain.  ?Skin: Negative.  Negative for rash.  ?Neurological: Negative.  Negative for dizziness and headaches.  ?All other systems reviewed and are negative. ? ?Today's Vitals  ? 07/18/21 1407  ?BP: 130/82  ?Pulse: 70  ?Temp: 97.9 ?F (36.6 ?C)  ?TempSrc: Oral  ?SpO2: 97%  ?Weight: 171 lb (77.6 kg)  ?Height: '5\' 2"'$  (1.575 m)  ? ?Body mass index is 31.28 kg/m?. ? ?Physical Exam ?Vitals reviewed.  ?Constitutional:    ?   Appearance: Normal appearance.  ?HENT:  ?   Head: Normocephalic.  ?   Right Ear: Tympanic membrane, ear canal and external ear normal.  ?   Left Ear: Tympanic membrane, ear canal and external ear normal.  ?Eyes:  ?   Extraocular Movements: Extraocular movements intact.  ?   Conjunctiva/sclera: Conjunctivae normal.  ?   Pupils: Pupils are equal, round, and reactive to light.  ?Cardiovascular:  ?   Rate and Rhythm: Normal rate and regular rhythm.  ?   Pulses: Normal pulses.  ?   Heart sounds: Normal heart sounds.  ?Pulmonary:  ?   Effort: Pulmonary effort is normal.  ?   Breath sounds: Normal breath sounds.  ?Abdominal:  ?   General: There is no distension.  ?   Palpations: Abdomen is soft.  ?   Tenderness: There is no abdominal tenderness.  ?Musculoskeletal:     ?   General: Normal range of motion.  ?   Cervical back: No tenderness.  ?Lymphadenopathy:  ?   Cervical: No cervical adenopathy.  ?Skin: ?   General: Skin is warm and dry.  ?Neurological:  ?   General: No focal deficit present.  ?   Mental Status: She is alert and oriented to person, place, and time.  ?Psychiatric:     ?   Mood and Affect: Mood normal.     ?   Behavior: Behavior normal.  ? ? ? ?ASSESSMENT & PLAN: ?Problem List Items Addressed This Visit   ? ?  ? Other  ? History of systemic lupus erythematosus ([place])  ? ?Other Visit Diagnoses   ? ? Routine general medical examination at a health care facility    -  Primary  ? Relevant Orders  ? Ambulatory referral to Gynecology  ? Need for hepatitis C screening test      ? Relevant Orders  ? Hepatitis C antibody screen  ? Screening for HIV (human immunodeficiency virus)      ? Relevant Orders  ? HIV antibody  ? Visit for screening mammogram      ? Relevant Orders  ? Mammogram Digital Screening  ? Colon cancer screening      ? Relevant Orders  ? Ambulatory referral to Gastroenterology  ? Screening for deficiency anemia      ? Relevant Orders  ? CBC with Differential  ? Screening for lipoid disorders       ? Relevant Orders  ? Lipid panel  ? Screening for endocrine, metabolic and immunity disorder      ? Relevant Orders  ? Comprehensive metabolic panel  ? Hemoglobin A1c  ? ?  ? ?Modifiable risk factors discussed with patient. ?Anticipatory guidance according to age provided. ?The following topics were also discussed: ?Social Determinants of Health ?Smoking.  Non-smoker ?Diet and nutrition ?Benefits of exercise ?Cancer  screening and need for colon and breast cancer screening with colonoscopy and mammogram ?Vaccinations recommendations ?Cardiovascular risk assessment and need for blood work ?Mental health including depression and anxiety ?Fall and accident prevention ? ?Patient Instructions  ?Mantenimiento de [employer] mujeres ?Health Maintenance, Female ?Adoptar un estilo de vida saludable y recibir atenci?n preventiva son importantes para promover la salud y Musician. Consulte al m?dico sobre: ?El esquema adecuado para [place] pruebas y ex?menes peri?dicos. ?Cosas que puede hacer por su cuenta para prevenir enfermedades y [place] sano. ??Qu? debo saber sobre la dieta, el peso y el ejercicio? ?Consuma una dieta saludable ? ?Consuma una dieta que incluya muchas verduras, frutas, productos l?cteos con bajo contenido de grasa y prote?nas magras. ?No consuma muchos alimentos ricos en grasas s?lidas, az?cares agregados o sodio. ?Mantenga un peso saludable ?El ?ndice de masa muscular [facility]) se [place] para identificar problemas de peso. Proporciona una estimaci?n de la grasa corporal bas?ndose en el peso y la altura. Su m?dico puede ayudarle a Radiation protection practitioner [place] y a Scientist, forensic o Theatre manager un peso saludable. ?Haga ejercicio con regularidad ?Haga ejercicio con regularidad. Esta es una de las pr?cticas m?s importantes que puede hacer por su salud. La mayor?a de los adultos deben seguir estas pautas: ?Realizar, al menos, 150 minutos de [place] f?sica por semana. El ejercicio debe aumentar la frecuencia card?aca y  Nature conservation officer transpirar (ejercicio de intensidad moderada). ?Hacer ejercicios de fortalecimiento por lo [employer] por semana. Agregue esto a su plan de ejercicio de intensidad moderada. ?Pase menos tiempo se

## 2021-07-19 ENCOUNTER — Encounter: Payer: Self-pay | Admitting: Gastroenterology

## 2021-07-23 ENCOUNTER — Ambulatory Visit (INDEPENDENT_AMBULATORY_CARE_PROVIDER_SITE_OTHER): Payer: 59 | Admitting: Orthopaedic Surgery

## 2021-07-23 ENCOUNTER — Encounter: Payer: Self-pay | Admitting: Orthopaedic Surgery

## 2021-07-23 VITALS — BP 113/70 | Ht 62.0 in | Wt 171.0 lb

## 2021-07-23 DIAGNOSIS — G8929 Other chronic pain: Secondary | ICD-10-CM

## 2021-07-23 DIAGNOSIS — M329 Systemic lupus erythematosus, unspecified: Secondary | ICD-10-CM

## 2021-07-23 DIAGNOSIS — M545 Low back pain, unspecified: Secondary | ICD-10-CM | POA: Diagnosis not present

## 2021-07-23 DIAGNOSIS — M47812 Spondylosis without myelopathy or radiculopathy, cervical region: Secondary | ICD-10-CM | POA: Diagnosis not present

## 2021-07-23 MED ORDER — GABAPENTIN 300 MG PO CAPS: ORAL_CAPSULE | ORAL | 1 refills | Status: AC
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
## 2021-07-24 ENCOUNTER — Other Ambulatory Visit: Payer: 59

## 2021-07-24 LAB — COMPREHENSIVE METABOLIC PANEL
ALT: 22 U/L (ref 0–35)
AST: 16 U/L (ref 0–37)
Albumin: 4.2 g/dL (ref 3.5–5.2)
Alkaline Phosphatase: 67 U/L (ref 39–117)
BUN: 11 mg/dL (ref 6–23)
CO2: 26 mEq/L (ref 19–32)
Calcium: 9.2 mg/dL (ref 8.4–10.5)
Chloride: 105 mEq/L (ref 96–112)
Creatinine, Ser: 0.68 mg/dL (ref 0.40–1.20)
GFR: 103.56 mL/min (ref >60.00)
Glucose, Bld: 90 mg/dL (ref 70–99)
Potassium: 4 mEq/L (ref 3.5–5.1)
Sodium: 138 mEq/L (ref 135–145)
Total Bilirubin: 0.4 mg/dL (ref 0.2–1.2)
Total Protein: 7.1 g/dL (ref 6.0–8.3)

## 2021-07-24 LAB — LIPID PANEL
Cholesterol: 196 mg/dL (ref 0–200)
HDL: 51.5 mg/dL (ref >39.00)
LDL Cholesterol: 126 mg/dL — ABNORMAL HIGH (ref 0–99)
NonHDL: 144.94
Total CHOL/HDL Ratio: 4
Triglycerides: 96 mg/dL (ref 0.0–149.0)
VLDL: 19.2 mg/dL (ref 0.0–40.0)

## 2021-07-24 LAB — CBC WITH DIFFERENTIAL/PLATELET
Basophils Absolute: 0.1 10*3/uL (ref 0.0–0.1)
Basophils Relative: 0.9 % (ref 0.0–3.0)
Eosinophils Absolute: 0.1 10*3/uL (ref 0.0–0.7)
Eosinophils Relative: 2.5 % (ref 0.0–5.0)
HCT: 40.5 % (ref 36.0–46.0)
Hemoglobin: 13.4 g/dL (ref 12.0–15.0)
Lymphocytes Relative: 28.7 % (ref 12.0–46.0)
Lymphs Abs: 1.6 10*3/uL (ref 0.7–4.0)
MCHC: 33.2 g/dL (ref 30.0–36.0)
MCV: 84.7 fl (ref 78.0–100.0)
Monocytes Absolute: 0.4 10*3/uL (ref 0.1–1.0)
Monocytes Relative: 6.8 % (ref 3.0–12.0)
Neutro Abs: 3.4 10*3/uL (ref 1.4–7.7)
Neutrophils Relative %: 61.1 % (ref 43.0–77.0)
Platelets: 280 10*3/uL (ref 150.0–400.0)
RBC: 4.78 Mil/uL (ref 3.87–5.11)
RDW: 14.1 % (ref 11.5–15.5)
WBC: 5.6 10*3/uL (ref 4.0–10.5)

## 2021-07-24 LAB — HEMOGLOBIN A1C: Hgb A1c MFr Bld: 5.3 % (ref 4.6–6.5)

## 2021-07-25 LAB — HEPATITIS C ANTIBODY
Hepatitis C Ab: NONREACTIVE
SIGNAL TO CUT-OFF: 0.08 (ref <1.00)

## 2021-07-25 LAB — HIV ANTIBODY (ROUTINE TESTING W REFLEX): HIV 1&2 Ab, 4th Generation: NONREACTIVE

## 2021-07-26 ENCOUNTER — Ambulatory Visit (HOSPITAL_BASED_OUTPATIENT_CLINIC_OR_DEPARTMENT_OTHER)
Admission: RE | Admit: 2021-07-26 | Discharge: 2021-07-26 | Disposition: A | Discharge status: Home / Self Care | Payer: 59 | Source: Ambulatory Visit | Attending: Emergency Medicine | Admitting: Emergency Medicine

## 2021-07-26 DIAGNOSIS — Z1231 Encounter for screening mammogram for malignant neoplasm of breast: Secondary | ICD-10-CM | POA: Diagnosis present

## 2021-07-26 IMAGING — MG MM DIGITAL SCREENING BILAT W/ TOMO AND CAD
8 series · 8 of 24 positions shown · non-contrast
Comparison: Previous exam(s).

CLINICAL DATA: Screening.

EXAM:
DIGITAL SCREENING BILATERAL MAMMOGRAM WITH TOMOSYNTHESIS AND CAD
TECHNIQUE: Bilateral screening digital craniocaudal and mediolateral oblique
mammograms were obtained. Bilateral screening digital breast
tomosynthesis was performed. The images were evaluated with
computer-aided detection.

[L MLO synth-2D]
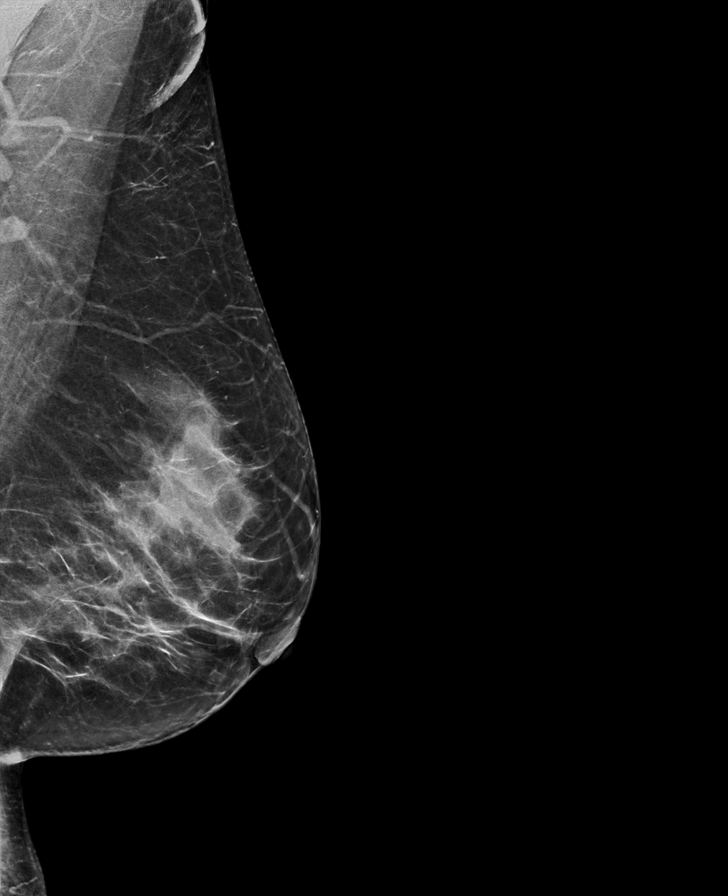

[L CC synth-2D]
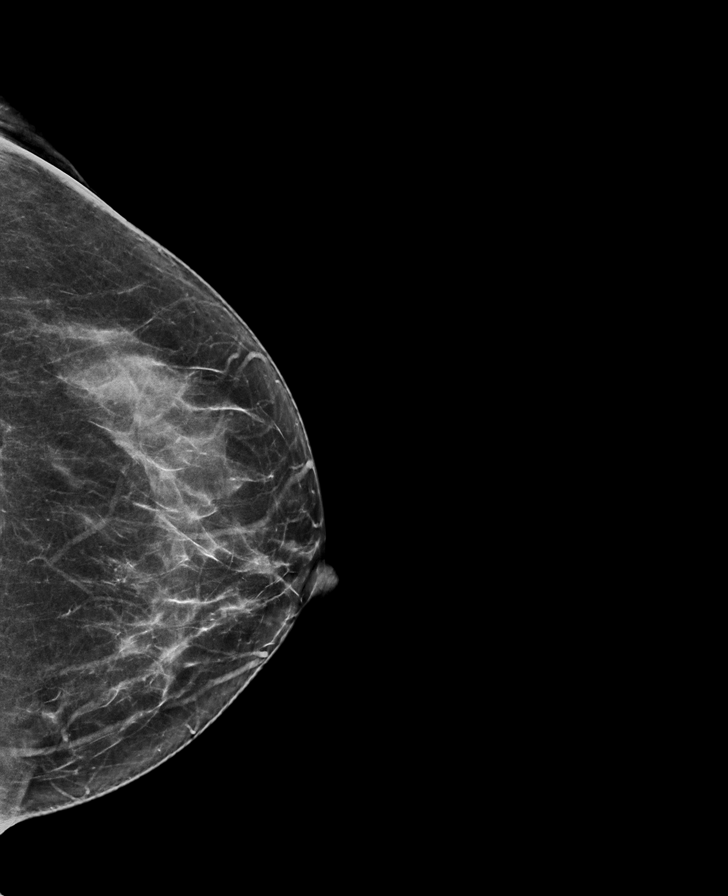

[R MLO synth-2D]
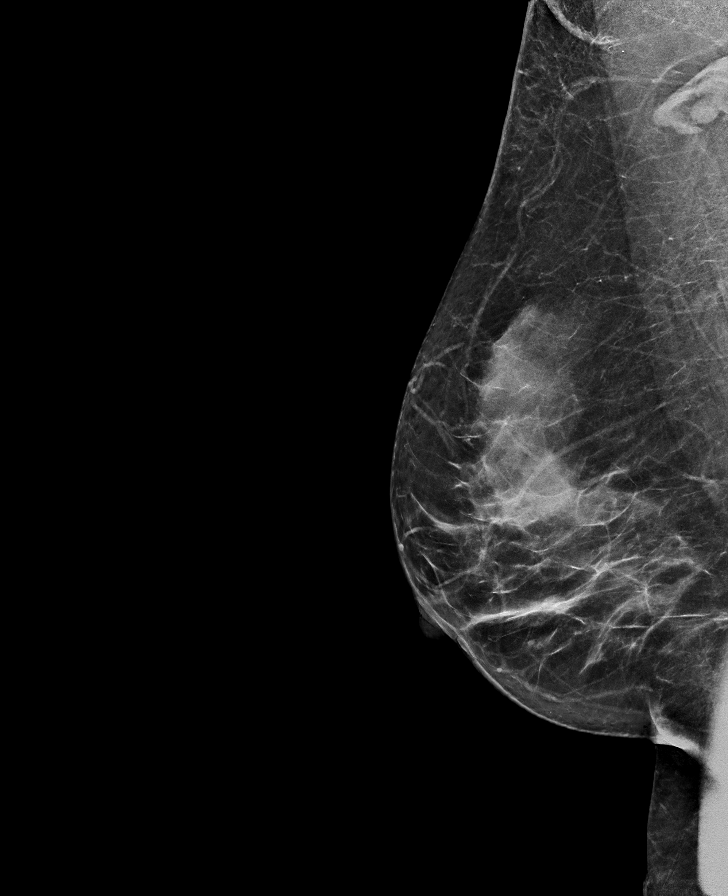

[R CC synth-2D]
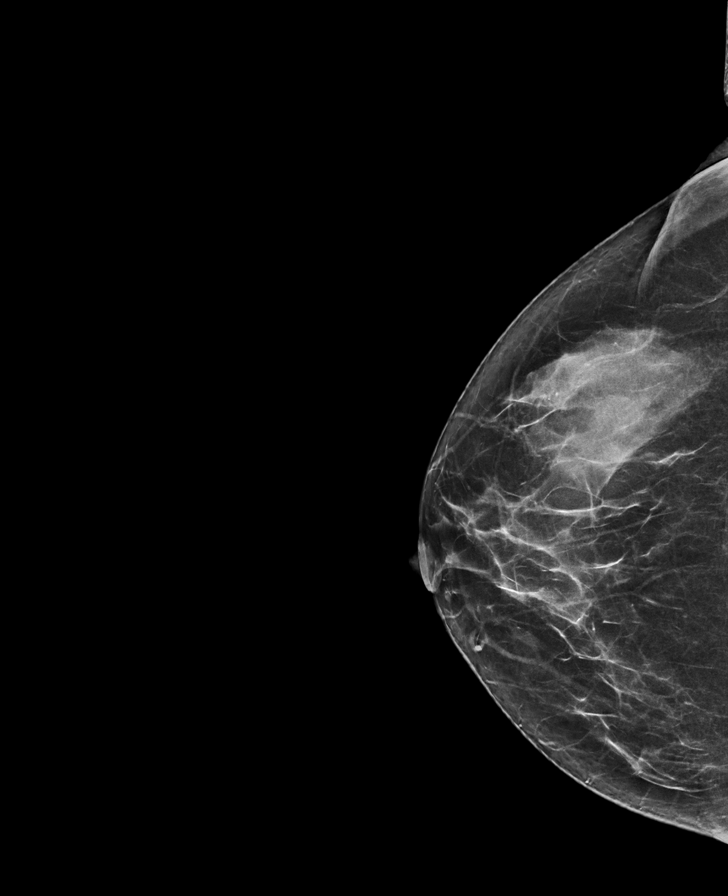

[R CC tomo · tomo slice 33/65.0]
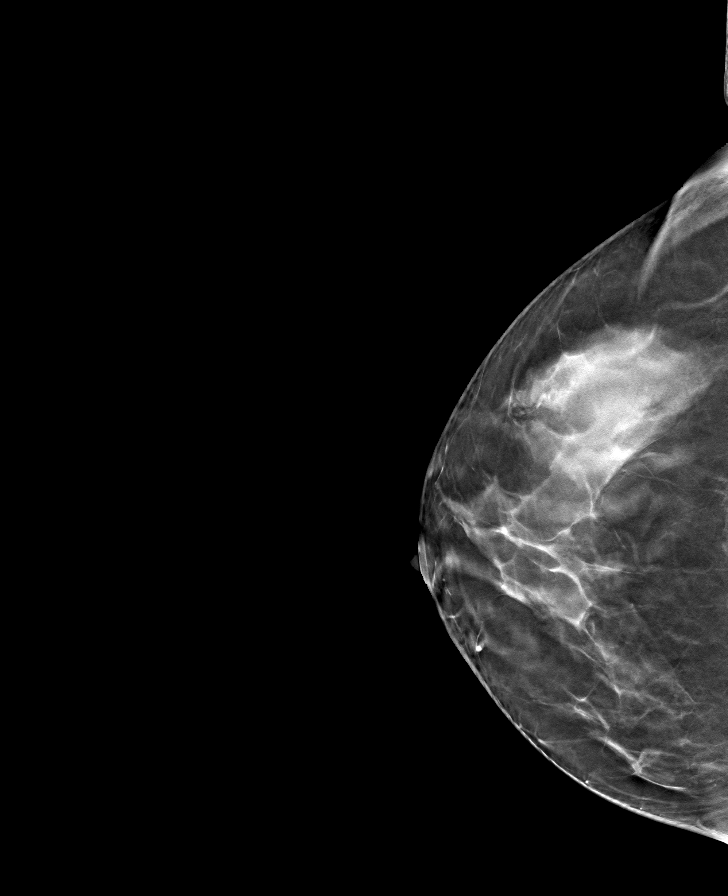

[R MLO tomo · tomo slice 36/71.0]
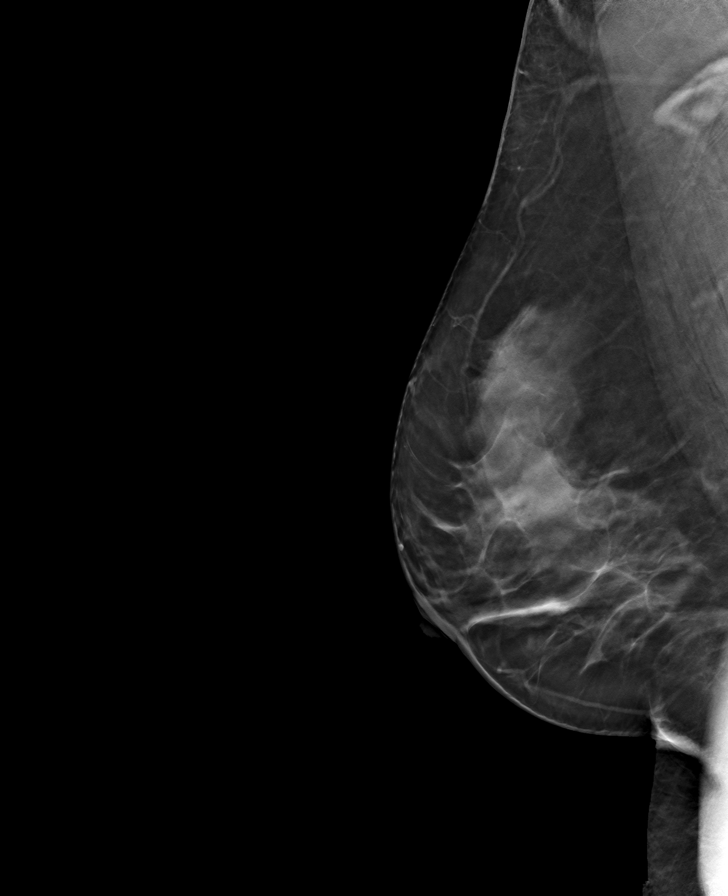

[L MLO tomo · tomo slice 37/74.0]
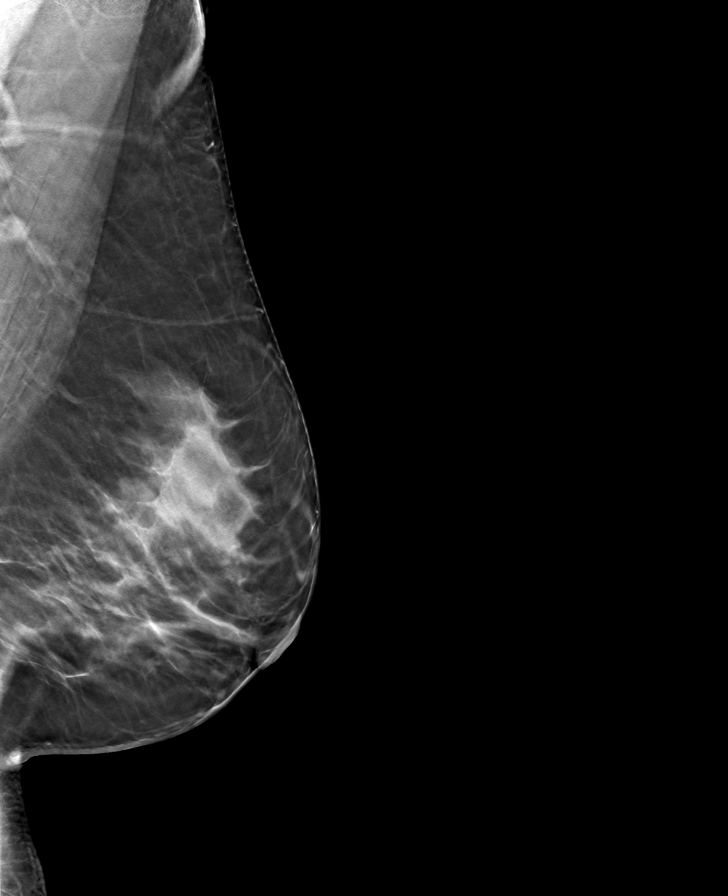

[L CC tomo · tomo slice 35/68.0]
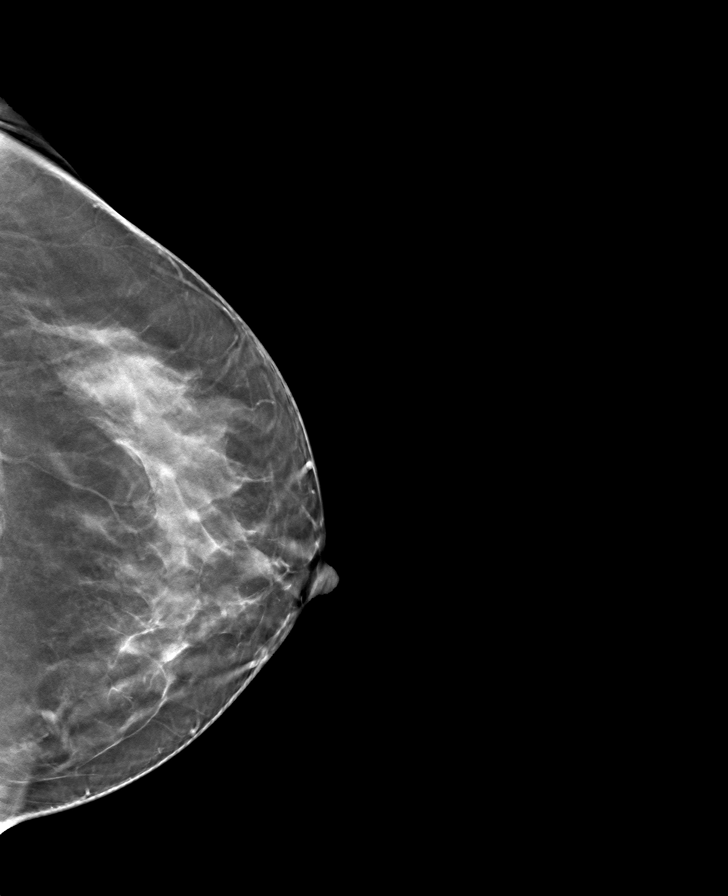

[8 of 24 positions shown; findings below may reference images not displayed]

ACR Breast Density Category c: The breast tissue is heterogeneously
dense, which may obscure small masses.
FINDINGS: There are no findings suspicious for malignancy.
IMPRESSION: No mammographic evidence of malignancy. A result letter of this
screening mammogram will be mailed directly to the patient.

RECOMMENDATION:
Screening mammogram in one year. (Code:[V2])

BI-RADS CATEGORY  1: Negative.

## 2021-08-08 ENCOUNTER — Ambulatory Visit (AMBULATORY_SURGERY_CENTER): Payer: 59 | Admitting: *Deleted

## 2021-08-08 VITALS — Ht 66.0 in | Wt 160.0 lb

## 2021-08-08 DIAGNOSIS — Z1211 Encounter for screening for malignant neoplasm of colon: Secondary | ICD-10-CM

## 2021-08-08 MED ORDER — PEG 3350-KCL-NA BICARB-NACL 420 G PO SOLR: 4000.0000 mL | Freq: Once | ORAL | 0 refills | Status: AC

## 2021-08-08 NOTE — Progress Notes (Signed)
No egg or soy allergy known to patient  ?No issues known to pt with past sedation with any surgeries or procedures ?Patient denies ever being told they had issues or difficulty with intubation  ?No FH of Malignant Hyperthermia ?Pt is not on diet pills ?Pt is not on  home 02  ?Pt is not on blood thinners  ?Pt denies issues with constipation  ?No A fib or A flutter ? ? ?PV completed over the phone with an interpreter. Pt verified name, DOB, address and insurance during PV today.  ?Pt mailed instruction packet with copy of consent form to read and not return, and instructions.  ?Pt encouraged to call with questions or issues.  ?If pt has My chart, procedure instructions sent via My Chart  ?Insurance confirmed with pt at [facility] today   ?

## 2021-08-22 ENCOUNTER — Encounter: Payer: Self-pay | Admitting: Gastroenterology

## 2021-08-23 ENCOUNTER — Ambulatory Visit (INDEPENDENT_AMBULATORY_CARE_PROVIDER_SITE_OTHER): Payer: 59 | Admitting: Orthopaedic Surgery

## 2021-08-23 ENCOUNTER — Encounter: Payer: Self-pay | Admitting: Orthopaedic Surgery

## 2021-08-23 VITALS — BP 112/70 | HR 66 | Ht 66.0 in | Wt 160.0 lb

## 2021-08-23 DIAGNOSIS — M47812 Spondylosis without myelopathy or radiculopathy, cervical region: Secondary | ICD-10-CM
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
## 2021-08-27 ENCOUNTER — Ambulatory Visit (AMBULATORY_SURGERY_CENTER): Payer: 59 | Admitting: Gastroenterology

## 2021-08-27 ENCOUNTER — Encounter: Payer: Self-pay | Admitting: Gastroenterology

## 2021-08-27 VITALS — BP 102/63 | HR 56 | Temp 97.1°F | Resp 13 | Ht 62.0 in | Wt 160.0 lb

## 2021-08-27 DIAGNOSIS — Z1211 Encounter for screening for malignant neoplasm of colon: Secondary | ICD-10-CM | POA: Diagnosis present

## 2021-08-27 DIAGNOSIS — D120 Benign neoplasm of cecum: Secondary | ICD-10-CM

## 2021-08-27 DIAGNOSIS — D128 Benign neoplasm of rectum: Secondary | ICD-10-CM

## 2021-08-27 MED ORDER — SODIUM CHLORIDE 0.9 % IV SOLN: 500.0000 mL | Freq: Once | INTRAVENOUS | Status: DC

## 2021-08-27 NOTE — Progress Notes (Signed)
All question asked and answered via interpreter. VS DT  Pt's states no medical or surgical changes since previsit or office visit.

## 2021-08-27 NOTE — Patient Instructions (Addendum)
USTED TUVO UN PROCEDIMIENTO ENDOSCPICO HOY EN EL [facility]:   Lea el informe del procedimiento que se le entreg para cualquier pregunta especfica sobre lo que se encontr durante su examen.  Si el informe del examen no responde a sus preguntas, por favor llame a su gastroenterlogo para aclararlo.  Si usted solicit que no se le den detalles de lo que se encontr en su procedimiento al acompaante que le va a cuidar, entonces el informe del procedimiento se ha incluido en un sobre sellado para que usted lo revise despus cuando le sea ms conveniente.   LO QUE PUEDE ESPERAR: Algunas sensaciones de hinchazn en el abdomen.  Puede tener ms gases de lo normal.  El caminar puede ayudarle a eliminar el aire que se le puso en el tracto gastrointestinal durante el procedimiento y reducir la hinchazn.  Si le hicieron una endoscopia inferior (como una colonoscopia o una sigmoidoscopia flexible), podra notar manchas de sangre en las heces fecales o en el papel higinico.  Si se someti a una preparacin intestinal para su procedimiento, es posible que no tenga una evacuacin intestinal normal durante algunos das.   Tenga en cuenta:  Es posible que note un poco de irritacin y congestin en la nariz o algn drenaje.  Esto es debido al oxgeno utilizado durante su procedimiento.  No hay que preocuparse y esto debe desaparecer ms o menos en un da.   SNTOMAS PARA REPORTAR INMEDIATAMENTE:  Despus de una endoscopia inferior (colonoscopia o sigmoidoscopia flexible):  Cantidades excesivas de sangre en las heces fecales  Sensibilidad significativa o empeoramiento de los dolores abdominales   Hinchazn aguda del abdomen que antes no tena   Fiebre de 100F o ms    Para asuntos urgentes o de emergencia, puede comunicarse con un gastroenterlogo a cualquier hora llamando al [phone].  DIETA:  Recomendamos una comida pequea al principio, pero luego puede continuar con su dieta normal.   Tome muchos lquidos, pero debe evitar las bebidas alcohlicas durante 24 horas.    ACTIVIDAD:  Debe planear tomarse las cosas con calma por el resto del da y no debe CONDUCIR ni usar maquinaria pesada hasta maana (debido a los medicamentos de sedacin utilizados durante el examen).     SEGUIMIENTO: Nuestro personal llamar al nmero que aparece en su historial al siguiente da hbil de su procedimiento para ver cmo se siente y para responder cualquier pregunta o inquietud que pueda tener con respecto a la informacin que se le dio despus del procedimiento. Si no podemos contactarle, le dejaremos un mensaje.  Sin embargo, si se siente bien y no tiene ningn problema, no es necesario que nos devuelva la llamada.  Asumiremos que ha regresado a sus actividades diarias normales sin incidentes. Si se le tomaron algunas biopsias, le contactaremos por telfono o por carta en las prximas 3 semanas.  Si no ha sabido nada sobre las biopsias en el transcurso de 3 semanas, por favor llmenos al [phone].   FIRMAS/CONFIDENCIALIDAD: Usted y/o el acompaante que le cuide han firmado documentos que se ingresarn en su historial mdico electrnico.  Estas firmas atestiguan el hecho de que la informacin anterior  

## 2021-08-27 NOTE — Progress Notes (Signed)
To pacu, VSS. Report to [email or web address] 

## 2021-08-27 NOTE — Progress Notes (Signed)
History and Physical:  This patient presents for endoscopic testing for: Encounter Diagnosis  Name Primary?   Special screening for malignant neoplasms, colon Yes    Patient denies chronic abdominal pain, rectal bleeding, constipation or diarrhea. First screening exam - average risk Spanish interpreter present for entire pre-procedure evaluation. Patient is otherwise without complaints or active issues today.   Past Medical History: Past Medical History:  Diagnosis Date   Asthma    Lupus ([place])    Thyroid disease      Past Surgical History: History reviewed. No pertinent surgical history.  Allergies: Allergies  Allergen Reactions   Poison Ivy Extract Hives    Outpatient Meds: Current Outpatient Medications  Medication Sig Dispense Refill   fluticasone (CUTIVATE) 0.05 % cream SMARTSIG:Topical 1-2 Times Daily PRN     HYDROcodone-acetaminophen (NORCO/VICODIN) 5-325 MG tablet 1 tablet as needed     hydroxychloroquine (PLAQUENIL) 200 MG tablet Take by mouth daily.     norelgestromin-ethinyl estradiol (ORTHO EVRA) 150-35 MCG/24HR transdermal patch Place 1 patch onto the skin once a week. 12 patch 3   triamcinolone cream (KENALOG) 0.1 % Apply 1 application topically 2 (two) times daily. 30 g 0   cyclobenzaprine (FLEXERIL) 10 MG tablet Take 1 tablet (10 mg total) by mouth at bedtime. 30 tablet 0   diclofenac (VOLTAREN) 75 MG EC tablet Take 1 tablet (75 mg total) by mouth 2 (two) times daily. 60 tablet 1   gabapentin (NEURONTIN) 300 MG capsule Take one at night times one week then one po bid. 60 capsule 1   methylPREDNISolone (MEDROL DOSEPAK) 4 MG TBPK tablet Sig as indicated (Patient not taking: Reported on 07/23/2021) 21 tablet 1   Current Facility-Administered Medications  Medication Dose Route Frequency Provider Last Rate Last Admin   0.9 %  sodium chloride infusion  500 mL Intravenous Once Doran Stabler, MD           ___________________________________________________________________ Objective   Exam:  BP (!) 97/58   Pulse 62   Temp (!) 97.1 F (36.2 C)   Ht '5\' 2"'$  (1.575 m)   Wt 160 lb (72.6 kg)   SpO2 99%   BMI 29.26 kg/m   CV: RRR without murmur, S1/S2 Resp: clear to auscultation bilaterally, normal RR and effort noted GI: soft, no tenderness, with active bowel sounds.   Assessment: Encounter Diagnosis  Name Primary?   Special screening for malignant neoplasms, colon Yes     Plan: Colonoscopy  The benefits and risks of the planned procedure were described in detail with the patient or (when appropriate) their health care proxy.  Risks were outlined as including, but not limited to, bleeding, infection, perforation, adverse medication reaction leading to cardiac or pulmonary decompensation, pancreatitis (if ERCP).  The limitation of incomplete mucosal visualization was also discussed.  No guarantees or warranties were given.    The patient is appropriate for an endoscopic procedure in the ambulatory setting.   - Wilfrid Lund, MD

## 2021-08-27 NOTE — Progress Notes (Signed)
Called to room to assist during endoscopic procedure.  Patient ID and intended procedure confirmed with present staff. Received instructions for my participation in the procedure from the performing physician.  

## 2021-08-27 NOTE — Op Note (Signed)
[place] Patient Name: Tara Caldwell Procedure Date: 08/27/2021 10:08 AM MRN: [id number] Endoscopist: Mallie Mussel L. Loletha Carrow , MD Age: 46 Referring MD:  Date of Birth: [birth year] Gender: Female Account #: [id number] Procedure:                Colonoscopy Indications:              Screening for colorectal malignant neoplasm, This                            is the patient's first colonoscopy Medicines:                Monitored Anesthesia Care Procedure:                Pre-Anesthesia Assessment:                           - Prior to the procedure, a History and Physical                            was performed, and patient medications and                            allergies were reviewed. The patient's tolerance of                            previous anesthesia was also reviewed. The risks                            and benefits of the procedure and the sedation                            options and risks were discussed with the patient.                            All questions were answered, and informed consent                            was obtained. Prior Anticoagulants: The patient has                            taken no previous anticoagulant or antiplatelet                            agents. ASA Grade Assessment: II - A patient with                            mild systemic disease. After reviewing the risks                            and benefits, the patient was deemed in                            satisfactory condition to undergo the procedure.  After obtaining informed consent, the colonoscope                            was passed under direct vision. Throughout the                            procedure, the patient's blood pressure, pulse, and                            oxygen saturations were monitored continuously. The                            CF [id number] #[id number] was introduced through the anus                            and advanced to  the the cecum, identified by                            appendiceal orifice and ileocecal valve. The                            colonoscopy was performed without difficulty. The                            patient tolerated the procedure well. The quality                            of the bowel preparation was excellent. The                            ileocecal valve, appendiceal orifice, and rectum                            were photographed. The bowel preparation used was                            SUPREP. Scope In: 10:15:42 AM Scope Out: 10:32:52 AM Scope Withdrawal Time: 0 hours 14 minutes 43 seconds  Total Procedure Duration: 0 hours 17 minutes 10 seconds  Findings:                 The perianal and digital rectal examinations were                            normal.                           Repeat examination of right colon under NBI                            performed.                           A 6-8 mm polyp was found in the cecum. The polyp  was sessile. The polyp was removed with a piecemeal                            technique using a cold snare (and two edge pieces                            with cold Bx). Resection and retrieval were                            complete.                           A 6-8 mm polyp was found in the rectum. The polyp                            was sessile. The polyp was removed with a cold                            snare. Resection and retrieval were complete.                           The exam was otherwise without abnormality on                            direct and retroflexion views. Complications:            No immediate complications. Estimated Blood Loss:     Estimated blood loss was minimal. Impression:               - One 6-8 mm polyp in the cecum, removed piecemeal                            using a cold snare. Resected and retrieved.                           - One 6-8 mm polyp in the rectum, removed with a                             cold snare. Resected and retrieved.                           - The examination was otherwise normal on direct                            and retroflexion views. Recommendation:           - Patient has a contact number available for                            emergencies. The signs and symptoms of potential                            delayed complications were discussed with the  patient. Return to normal activities tomorrow.                            Written discharge instructions were provided to the                            patient.                           - Resume previous diet.                           - Continue present medications.                           - Await pathology results.                           - Repeat colonoscopy is recommended for                            surveillance. The colonoscopy date will be                            determined after pathology results from today's                            exam become available for review. Suzann Lazaro L. Loletha Carrow, MD 08/27/2021 10:39:47 AM This report has been signed electronically.

## 2021-08-28 ENCOUNTER — Telehealth: Payer: Self-pay

## 2021-08-28 NOTE — Telephone Encounter (Signed)
Follow up call placed, no answer/no vM. SChaplin, RN,BSN

## 2021-08-28 NOTE — Telephone Encounter (Signed)
  Follow up Call-     08/27/2021    9:45 AM  Call back number  Post procedure Call Back phone  # [phone]     Patient questions:  Do you have a fever, pain , or abdominal swelling? No. Pain Score  0 *  Have you tolerated food without any problems? Yes.    Have you been able to return to your normal activities? Yes.    Do you have any questions about your discharge instructions: Diet   No. Medications  No. Follow up visit  No.  Do you have questions or concerns about your Care? No.  Actions: * If pain score is 4 or above: No action needed, pain <4.

## 2021-08-30 ENCOUNTER — Encounter: Payer: Self-pay | Admitting: Gastroenterology

## 2021-10-31 ENCOUNTER — Telehealth: Payer: Self-pay | Admitting: Orthopaedic Surgery

## 2021-10-31 NOTE — Telephone Encounter (Signed)
Patient came by office to obtain another copy of her records. Auth on file. She request 2 copies. Ready at front desk for patient to pickup.

## 2021-11-10 ENCOUNTER — Other Ambulatory Visit: Payer: Self-pay | Admitting: Nurse Practitioner

## 2021-11-10 DIAGNOSIS — Z30016 Encounter for initial prescription of transdermal patch hormonal contraceptive device: Secondary | ICD-10-CM

## 2021-11-11 NOTE — Telephone Encounter (Signed)
Last annual exam was 09/2020 No annual exam scheduled

## 2022-01-30 ENCOUNTER — Other Ambulatory Visit: Payer: Self-pay | Admitting: Nurse Practitioner

## 2022-01-30 DIAGNOSIS — Z30016 Encounter for initial prescription of transdermal patch hormonal contraceptive device: Secondary | ICD-10-CM

## 2022-01-31 NOTE — Telephone Encounter (Signed)
Last annual exam was 09/19/2020 No annual exam scheduled yet, note was attached to Rx to schedule annual exam.
